# Patient Record
Sex: Male | Born: 1975
Health system: Southern US, Community
[De-identification: ages and names within clinical notes are randomized; demographics above are authoritative.]

## PROBLEM LIST (undated history)

## (undated) DIAGNOSIS — Z800 Family history of malignant neoplasm of digestive organs: Secondary | ICD-10-CM

## (undated) DIAGNOSIS — K589 Irritable bowel syndrome without diarrhea: Secondary | ICD-10-CM

## (undated) HISTORY — PX: WISDOM TOOTH EXTRACTION: SHX21

## (undated) HISTORY — DX: Family history of malignant neoplasm of digestive organs: Z80.0

## (undated) HISTORY — PX: TONSILLECTOMY: SUR1361

## (undated) HISTORY — DX: Irritable bowel syndrome, unspecified: K58.9

---

## 2006-04-21 ENCOUNTER — Encounter: Admission: RE | Admit: 2006-04-21 | Discharge: 2006-04-21 | Payer: Self-pay | Admitting: Gastroenterology

## 2019-05-24 ENCOUNTER — Telehealth: Payer: Self-pay

## 2019-05-24 NOTE — Telephone Encounter (Signed)

## 2019-05-25 ENCOUNTER — Ambulatory Visit: Payer: BC Managed Care – PPO | Admitting: Family Medicine

## 2019-05-25 ENCOUNTER — Encounter: Payer: Self-pay | Admitting: Family Medicine

## 2019-05-25 VITALS — BP 118/70 | HR 60 | Ht 70.0 in | Wt 176.5 lb

## 2019-05-25 DIAGNOSIS — Z800 Family history of malignant neoplasm of digestive organs: Secondary | ICD-10-CM

## 2019-05-25 DIAGNOSIS — D229 Melanocytic nevi, unspecified: Secondary | ICD-10-CM | POA: Diagnosis not present

## 2019-05-25 DIAGNOSIS — Z0000 Encounter for general adult medical examination without abnormal findings: Secondary | ICD-10-CM | POA: Diagnosis not present

## 2019-05-25 NOTE — Patient Instructions (Signed)

## 2019-05-25 NOTE — Progress Notes (Signed)
Established Patient Office Visit  Subjective:  Patient ID: William Rasmussen, male    DOB: 08/26/1976  Age: 43 y.o. MRN: [id number]  CC:  Chief Complaint  Patient presents with  . Establish Care  . Annual Exam    HPI William Rasmussen presents for establishment of care and a complete physical.  He is nonfasting today.  In general enjoys good health as far as he knows.  He lives with his wife and 15-monthold daughter.  They are scheduled to have a son at the end of August for LTC S.  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  He does not smoke or use illicit drugs.  He drinks beers on occasion.  Father died at age 2866secondary to chronic lymphocytic leukemia.  Mom is 720and was recently diagnosed with colon cancer she has a current colostomy.  Patient does have a history of irritable bowel syndrome.  It has not bothered him in some time now.  History reviewed. No pertinent past medical history.  History reviewed. No pertinent surgical history.  History reviewed. No pertinent family history.  Social History   Socioeconomic History  . Marital status: Married    Spouse name: Not on file  . Number of children: Not on file  . Years of education: Not on file  . Highest education level: Not on file  Occupational History  . Not on file  Social Needs  . Financial resource strain: Not on file  . Food insecurity    Worry: Not on file    Inability: Not on file  . Transportation needs    Medical: Not on file    Non-medical: Not on file  Tobacco Use  . Smoking status: Never Smoker  . Smokeless tobacco: Never Used  Substance and Sexual Activity  . Alcohol use: Yes    Comment: occassionally has a couple of beers.  . Drug use: Not Currently  . Sexual activity: Not on file  Lifestyle  . Physical activity    Days per week: Not on file    Minutes per session: Not on file  . Stress: Not on file  Relationships  . Social  [place] phone: Not on file    Gets together: Not on file    Attends religious service: Not on file    Active member of club or organization: Not on file    Attends meetings of clubs or organizations: Not on file    Relationship status: Not on file  . Intimate partner violence    Fear of current or ex partner: Not on file    Emotionally abused: Not on file    Physically abused: Not on file    Forced sexual activity: Not on file  Other Topics Concern  . Not on file  Social History Narrative  . Not on file    No outpatient medications prior to visit.   No facility-administered medications prior to visit.     No Known Allergies  ROS Review of Systems  Constitutional: Negative for diaphoresis, fatigue, fever and unexpected weight change.  HENT: Negative.   Eyes: Negative for photophobia and visual disturbance.  Respiratory: Negative.   Cardiovascular: Negative.   Gastrointestinal: Negative for abdominal pain, anal bleeding, blood in stool, constipation, diarrhea, nausea and vomiting.  Endocrine: Negative for polyphagia and polyuria.  Genitourinary: Negative.   Musculoskeletal: Negative for joint swelling and myalgias.  Skin:  Negative for pallor and rash.  Allergic/Immunologic: Negative for immunocompromised state.  Neurological: Negative for light-headedness and headaches.  Hematological: Does not bruise/bleed easily.  Psychiatric/Behavioral: Negative.       Objective:    Physical Exam  Constitutional: He is oriented to person, place, and time. He appears well-developed and well-nourished. No distress.  HENT:  Head: Normocephalic and atraumatic.  Right Ear: External ear normal.  Left Ear: External ear normal.  Mouth/Throat: Oropharynx is clear and moist. No oropharyngeal exudate.  Eyes: Pupils are equal, round, and reactive to light. Conjunctivae are normal. Right eye exhibits no discharge. Left eye exhibits no discharge. No scleral icterus.  Neck:  Neck supple. No tracheal deviation present. No thyromegaly present.  Cardiovascular: Normal rate, regular rhythm and normal heart sounds.  Pulmonary/Chest: Effort normal and breath sounds normal. No stridor.  Abdominal: Soft. Bowel sounds are normal. He exhibits no distension and no mass. There is no abdominal tenderness. There is no rebound and no guarding. Hernia confirmed negative in the right inguinal area and confirmed negative in the left inguinal area.  Genitourinary: Right testis shows no mass, no swelling and no tenderness. Right testis is descended. Left testis shows no mass, no swelling and no tenderness. Left testis is descended. Circumcised. No hypospadias, penile erythema or penile tenderness. No discharge found.  Musculoskeletal:        General: No edema.     Lumbar back: He exhibits normal range of motion, no tenderness, no bony tenderness and no swelling.  Lymphadenopathy:    He has no cervical adenopathy.       Right: No inguinal adenopathy present.       Left: No inguinal adenopathy present.  Neurological: He is alert and oriented to person, place, and time.  Skin: Skin is warm and dry. He is not diaphoretic.     Psychiatric: He has a normal mood and affect. His behavior is normal.    BP 118/70   Pulse 60   Ht '5\' 10"'  (1.778 m)   Wt 176 lb 8 oz (80.1 kg)   SpO2 97%   BMI 25.33 kg/m  Wt Readings from Last 3 Encounters:  05/25/19 176 lb 8 oz (80.1 kg)   BP Readings from Last 3 Encounters:  05/25/19 118/70   Guideline developer:  UpToDate (see UpToDate for funding source) Date Released: June 2014  Health Maintenance Due  Topic Date Due  . HIV Screening  06/30/1993  . TETANUS/TDAP  06/30/1997    There are no preventive care reminders to display for this patient.  No results found for: TSH No results found for: WBC, HGB, HCT, MCV, PLT No results found for: NA, K, CHLORIDE, CO2, GLUCOSE, BUN, CREATININE, BILITOT, ALKPHOS, AST, ALT, PROT, ALBUMIN, CALCIUM,  ANIONGAP, EGFR, GFR No results found for: CHOL No results found for: HDL No results found for: LDLCALC No results found for: TRIG No results found for: CHOLHDL No results found for: HGBA1C    Assessment & Plan:   Problem List Items Addressed This Visit      Musculoskeletal and Integument   Multiple atypical nevi   Relevant Orders   Ambulatory referral to Dermatology     Other   Healthcare maintenance - Primary   Relevant Orders   CBC   Comprehensive metabolic panel   Lipid panel   Urinalysis, Routine w reflex microscopic   HIV Antibody (routine testing w rflx)   Family history of colon cancer in mother   Relevant Orders   Ambulatory referral  to Gastroenterology      No orders of the defined types were placed in this encounter.   Follow-up: Return in about 6 months (around 11/24/2019), or if symptoms worsen or fail to improve.

## 2019-05-29 ENCOUNTER — Telehealth (INDEPENDENT_AMBULATORY_CARE_PROVIDER_SITE_OTHER): Payer: BC Managed Care – PPO | Admitting: Gastroenterology

## 2019-05-29 ENCOUNTER — Other Ambulatory Visit: Payer: Self-pay

## 2019-05-29 ENCOUNTER — Encounter: Payer: Self-pay | Admitting: Gastroenterology

## 2019-05-29 VITALS — Ht 70.0 in | Wt 178.0 lb

## 2019-05-29 DIAGNOSIS — Z800 Family history of malignant neoplasm of digestive organs: Secondary | ICD-10-CM | POA: Diagnosis not present

## 2019-05-29 MED ORDER — CLENPIQ 10-3.5-12 MG-GM -GM/160ML PO SOLN: 1.0000 | ORAL | 0 refills | Status: DC

## 2019-05-29 NOTE — Progress Notes (Signed)
              Chief Complaint: FHx of CRC  Referring Provider:     Libby Maw, MD   HPI:    Due to current restrictions/limitations of in-office visits due to the COVID-19 pandemic, this scheduled clinical appointment was converted to a telehealth virtual consultation using Doximity.  -Time: 25 minutes -The patient did consent to this virtual visit and is aware of possible charges through their insurance for this visit.  -Names of all parties present: William Rasmussen (patient), Gerrit Heck, DO, [facility] (physician) -Patient location: Home -Physician location: Office  William Rasmussen is a 43 y.o. male referred to the Gastroenterology Clinic for evaluation of early CRC screening due to family history of CRC.  Family history notable for father died at age 65 2/2 CLL, and mother diagnosed with CRC at age 80.   He reports a history of IBS-Mixed type, which is well controlled with dietary modifications.  Has not had symptoms in a long time.  Patient otherwise denies nausea, vomiting, diarrhea, constipation, hematochezia, melena, night sweats, fever, chills, weight loss, early satiety, dysphagia, odynophagia.  CBC, CMP ordered; not yet done- scheduled to complete week.   Past medical history, past surgical history, social history, family history, medications, and allergies reviewed in the chart and with patient.    Past Medical History:  Diagnosis Date  . IBS (irritable bowel syndrome)      History reviewed. No pertinent surgical history. Family History  Problem Relation Age of Onset  . Colon cancer Mother 78   Social History   Tobacco Use  . Smoking status: Never Smoker  . Smokeless tobacco: Never Used  Substance Use Topics  . Alcohol use: Yes    Comment: occassionally has a couple of beers.  . Drug use: Not Currently   No current outpatient medications on file.   No current facility-administered medications for this visit.    No Known Allergies    Review of Systems: All systems reviewed and negative except where noted in HPI.     Physical Exam:    Complete physical exam not completed due to the nature of this telehealth communication.   Gen: Awake, alert, and oriented, and well communicative. HEENT: EOMI, non-icteric sclera, NCAT, MMM Neck: Normal movement of head and neck Pulm: No labored breathing, speaking in full sentences without conversational dyspnea Derm: No apparent lesions or bruising in visible field MS: Moves all visible extremities without noticeable abnormality Psych: Pleasant, cooperative, normal speech, thought processing seemingly intact   ASSESSMENT AND PLAN;   1) Family history of colon cancer: Mother diagnosed with CRC at age 81.  Per current societal guidelines, start colon cancer screening at age 19, with ongoing intervals pending colonoscopy findings.  -Schedule for colonoscopy  The indications, risks, and benefits of colonoscopy were explained to the patient in detail. Risks include but are not limited to bleeding, perforation, adverse reaction to medications, and cardiopulmonary compromise. Sequelae include but are not limited to the possibility of surgery, hospitalization, and mortality. The patient verbalized understanding and wished to proceed. All questions answered, referred to the scheduler and bowel prep ordered. Further recommendations pending results of the exam.    Lavena Bullion, DO, FACG  05/29/2019, 3:45 PM   Ethelene Hal Mortimer Fries,*

## 2019-05-29 NOTE — Patient Instructions (Signed)
If you are age 41 or older, your body mass index should be between 23-30. Your Body mass index is 25.54 kg/m. If this is out of the aforementioned range listed, please consider follow up with your Primary Care Provider.  If you are age 77 or younger, your body mass index should be between 19-25. Your Body mass index is 25.54 kg/m. If this is out of the aformentioned range listed, please consider follow up with your Primary Care Provider.   To help prevent the possible spread of infection to our patients, communities, and staff; we will be implementing the following measures:  As of now we are not allowing any visitors/family members to accompany you to any upcoming appointments with [facility]. If you have any concerns about this please contact our office to discuss prior to the appointment.   You have been scheduled for a colonoscopy. Please follow written instructions given to you at your visit today.  Please pick up your prep supplies at the pharmacy within the next 1-3 days. If you use inhalers (even only as needed), please bring them with you on the day of your procedure. Your physician has requested that you go to www.startemmi.com and enter the access code given to you at your visit today. This web site gives a general overview about your procedure. However, you should still follow specific instructions given to you by our office regarding your preparation for the procedure.  We have sent the following medications to your pharmacy for you to pick up at your convenience: Clenpiq  It was a pleasure to see you today!  Vito Cirigliano, D.O.

## 2019-06-01 ENCOUNTER — Other Ambulatory Visit (INDEPENDENT_AMBULATORY_CARE_PROVIDER_SITE_OTHER): Payer: BC Managed Care – PPO

## 2019-06-01 ENCOUNTER — Other Ambulatory Visit: Payer: Self-pay

## 2019-06-01 DIAGNOSIS — Z0000 Encounter for general adult medical examination without abnormal findings: Secondary | ICD-10-CM

## 2019-06-01 LAB — CBC
HCT: 44.1 % (ref 39.0–52.0)
Hemoglobin: 14.7 g/dL (ref 13.0–17.0)
MCHC: 33.4 g/dL (ref 30.0–36.0)
MCV: 101 fl — ABNORMAL HIGH (ref 78.0–100.0)
Platelets: 232 10*3/uL (ref 150.0–400.0)
RBC: 4.36 Mil/uL (ref 4.22–5.81)
RDW: 13.4 % (ref 11.5–15.5)
WBC: 5.1 10*3/uL (ref 4.0–10.5)

## 2019-06-01 LAB — LIPID PANEL
Cholesterol: 162 mg/dL (ref 0–200)
HDL: 39.3 mg/dL (ref >39.00)
LDL Cholesterol: 99 mg/dL (ref 0–99)
NonHDL: 122.82
Total CHOL/HDL Ratio: 4
Triglycerides: 119 mg/dL (ref 0.0–149.0)
VLDL: 23.8 mg/dL (ref 0.0–40.0)

## 2019-06-01 LAB — COMPREHENSIVE METABOLIC PANEL
ALT: 19 U/L (ref 0–53)
AST: 18 U/L (ref 0–37)
Albumin: 4.3 g/dL (ref 3.5–5.2)
Alkaline Phosphatase: 61 U/L (ref 39–117)
BUN: 12 mg/dL (ref 6–23)
CO2: 30 mEq/L (ref 19–32)
Calcium: 9.1 mg/dL (ref 8.4–10.5)
Chloride: 105 mEq/L (ref 96–112)
Creatinine, Ser: 0.89 mg/dL (ref 0.40–1.50)
GFR: 94.24 mL/min (ref >60.00)
Glucose, Bld: 90 mg/dL (ref 70–99)
Potassium: 4.2 mEq/L (ref 3.5–5.1)
Sodium: 142 mEq/L (ref 135–145)
Total Bilirubin: 1.3 mg/dL — ABNORMAL HIGH (ref 0.2–1.2)
Total Protein: 6.6 g/dL (ref 6.0–8.3)

## 2019-06-01 LAB — URINALYSIS, ROUTINE W REFLEX MICROSCOPIC
Bilirubin Urine: NEGATIVE
Hgb urine dipstick: NEGATIVE
Ketones, ur: NEGATIVE
Leukocytes,Ua: NEGATIVE
Nitrite: NEGATIVE
Specific Gravity, Urine: 1.02 (ref 1.000–1.030)
Total Protein, Urine: NEGATIVE
Urine Glucose: NEGATIVE
Urobilinogen, UA: 0.2 (ref 0.0–1.0)
pH: 8 (ref 5.0–8.0)

## 2019-06-02 LAB — HIV ANTIBODY (ROUTINE TESTING W REFLEX): HIV 1&2 Ab, 4th Generation: NONREACTIVE

## 2019-11-05 ENCOUNTER — Other Ambulatory Visit: Payer: Self-pay

## 2019-11-05 DIAGNOSIS — Z20822 Contact with and (suspected) exposure to covid-19: Secondary | ICD-10-CM

## 2019-11-06 LAB — NOVEL CORONAVIRUS, NAA: SARS-CoV-2, NAA: NOT DETECTED

## 2019-11-09 ENCOUNTER — Encounter: Payer: Self-pay | Admitting: Family Medicine

## 2019-11-09 DIAGNOSIS — Z3009 Encounter for other general counseling and advice on contraception: Secondary | ICD-10-CM

## 2019-11-12 NOTE — Telephone Encounter (Signed)
Left message for patient to call back to the office;  

## 2019-11-13 NOTE — Telephone Encounter (Signed)
Left message for patient to call back to the office;  

## 2019-11-14 NOTE — Telephone Encounter (Signed)
Called and spoke with patient-patient has been scheduled for his pre visit on 11/20/2019 at 8:30 am; patient has been scheduled for his COVID screening on 11/28/2019 at 8:10 am; patient has also been scheduled for his colonoscopy at [facility] on 12/04/2019 at 3:00 pm;   Patient advised to call back to the office at [phone] should questions/concerns arise;  Patient verbalized understanding of information/instructions;

## 2019-11-14 NOTE — Telephone Encounter (Signed)
Patient returned call to the office-message left for RN;  RN attempted to reach patient-left message for patient to call back to the office;

## 2019-11-20 ENCOUNTER — Encounter: Payer: Self-pay | Admitting: Gastroenterology

## 2019-11-20 ENCOUNTER — Ambulatory Visit (AMBULATORY_SURGERY_CENTER): Payer: Commercial Managed Care - PPO | Admitting: *Deleted

## 2019-11-20 ENCOUNTER — Other Ambulatory Visit: Payer: Self-pay

## 2019-11-20 VITALS — Temp 96.9°F | Ht 70.0 in | Wt 180.0 lb

## 2019-11-20 DIAGNOSIS — Z1159 Encounter for screening for other viral diseases: Secondary | ICD-10-CM

## 2019-11-20 DIAGNOSIS — Z800 Family history of malignant neoplasm of digestive organs: Secondary | ICD-10-CM

## 2019-11-20 MED ORDER — CLENPIQ 10-3.5-12 MG-GM -GM/160ML PO SOLN: 1.0000 | ORAL | 2 refills | Status: DC

## 2019-11-20 NOTE — Progress Notes (Signed)
No egg or soy allergy known to patient  No issues with past sedation with any surgeries  or procedures, no intubation problems  No diet pills per patient No home 02 use per patient  No blood thinners per patient  Pt denies issues with constipation  No A fib or A flutter  EMMI video sent to pt's e mail  Clenpiq coupon to pt today   Due to the COVID-19 pandemic we are asking patients to follow these guidelines. Please only bring one care partner. Please be aware that your care partner may wait in the car in the parking lot or if they feel like they will be too hot to wait in the car, they may wait in the lobby on the 4th floor. All care partners are required to wear a mask the entire time (we do not have any that we can provide them), they need to practice social distancing, and we will do a Covid check for all patient's and care partners when you arrive. Also we will check their temperature and your temperature. If the care partner waits in their car they need to stay in the parking lot the entire time and we will call them on their cell phone when the patient is ready for discharge so they can bring the car to the front of the building. Also all patient's will need to wear a mask into building.

## 2019-11-28 ENCOUNTER — Ambulatory Visit (INDEPENDENT_AMBULATORY_CARE_PROVIDER_SITE_OTHER): Payer: Commercial Managed Care - PPO

## 2019-11-28 ENCOUNTER — Other Ambulatory Visit: Payer: Self-pay | Admitting: Gastroenterology

## 2019-11-28 DIAGNOSIS — Z1159 Encounter for screening for other viral diseases: Secondary | ICD-10-CM

## 2019-11-29 LAB — SARS CORONAVIRUS 2 (TAT 6-24 HRS): SARS Coronavirus 2: NEGATIVE

## 2019-12-04 ENCOUNTER — Ambulatory Visit (AMBULATORY_SURGERY_CENTER): Payer: Commercial Managed Care - PPO | Admitting: Gastroenterology

## 2019-12-04 ENCOUNTER — Other Ambulatory Visit: Payer: Self-pay

## 2019-12-04 ENCOUNTER — Encounter: Payer: Self-pay | Admitting: Gastroenterology

## 2019-12-04 VITALS — BP 115/69 | HR 56 | Temp 98.5°F | Resp 19 | Ht 70.0 in | Wt 180.0 lb

## 2019-12-04 DIAGNOSIS — D125 Benign neoplasm of sigmoid colon: Secondary | ICD-10-CM

## 2019-12-04 DIAGNOSIS — K640 First degree hemorrhoids: Secondary | ICD-10-CM

## 2019-12-04 DIAGNOSIS — Z800 Family history of malignant neoplasm of digestive organs: Secondary | ICD-10-CM | POA: Diagnosis present

## 2019-12-04 DIAGNOSIS — Z1211 Encounter for screening for malignant neoplasm of colon: Secondary | ICD-10-CM

## 2019-12-04 MED ORDER — SODIUM CHLORIDE 0.9 % IV SOLN: 500.0000 mL | Freq: Once | INTRAVENOUS | Status: DC

## 2019-12-04 NOTE — Progress Notes (Signed)
Called to room to assist during endoscopic procedure.  Patient ID and intended procedure confirmed with present staff. Received instructions for my participation in the procedure from the performing physician.  

## 2019-12-04 NOTE — Progress Notes (Signed)
PT taken to PACU. Monitors in place. VSS. Report given to RN. 

## 2019-12-04 NOTE — Progress Notes (Signed)
Temp by JB, Vitals by CW   Pt's states no medical or surgical changes since previsit or office visit.  

## 2019-12-04 NOTE — Op Note (Signed)
[facility] Patient Name: William Rasmussen Procedure Date: 12/04/2019 2:24 PM MRN: [id number] Endoscopist: Gerrit Heck , MD Age: 43 Referring MD:  Date of Birth: [birth year] Gender: Male Account #: [id number] Procedure:                Colonoscopy Indications:              Screening in patient at increased risk: Colorectal                            cancer in mother 5 or older, This is the patient's                            first colonoscopy                           43 yo male presents for initial, increased risk CRC                            screening. Mother with CRC >age 56. Medicines:                Monitored Anesthesia Care Procedure:                Pre-Anesthesia Assessment:                           - Prior to the procedure, a History and Physical                            was performed, and patient medications and                            allergies were reviewed. The patient's tolerance of                            previous anesthesia was also reviewed. The risks                            and benefits of the procedure and the sedation                            options and risks were discussed with the patient.                            All questions were answered, and informed consent                            was obtained. Prior Anticoagulants: The patient has                            taken no previous anticoagulant or antiplatelet                            agents. ASA Grade Assessment: II - A patient with  mild systemic disease. After reviewing the risks                            and benefits, the patient was deemed in                            satisfactory condition to undergo the procedure.                           After obtaining informed consent, the colonoscope                            was passed under direct vision. Throughout the                            procedure, the patient's blood pressure, pulse, and                           oxygen saturations were monitored continuously. The                            Colonoscope was introduced through the anus and                            advanced to the the terminal ileum. The colonoscopy                            was performed without difficulty. The patient                            tolerated the procedure well. The quality of the                            bowel preparation was excellent. The terminal                            ileum, ileocecal valve, appendiceal orifice, and                            rectum were photographed. Scope In: 2:32:41 PM Scope Out: 2:50:31 PM Scope Withdrawal Time: 0 hours 14 minutes 49 seconds  Total Procedure Duration: 0 hours 17 minutes 50 seconds  Findings:                 The perianal and digital rectal examinations were                            normal.                           A 4 mm polyp was found in the sigmoid colon. The                            polyp was sessile. The polyp was removed with a  cold snare. Resection and retrieval were complete.                            Estimated blood loss was minimal.                           Non-bleeding internal hemorrhoids were found during                            retroflexion. The hemorrhoids were small.                           The exam was otherwise normal throughout the                            remainder of the colon.                           Retroflexion in the right colon was performed.                           The terminal ileum appeared normal. Complications:            No immediate complications. Estimated Blood Loss:     Estimated blood loss was minimal. Impression:               - One 4 mm polyp in the sigmoid colon, removed with                            a cold snare. Resected and retrieved.                           - Non-bleeding internal hemorrhoids.                           - The examined portion of the ileum  was normal. Recommendation:           - Patient has a contact number available for                            emergencies. The signs and symptoms of potential                            delayed complications were discussed with the                            patient. Return to normal activities tomorrow.                            Written discharge instructions were provided to the                            patient.                           - Resume previous diet.                           -  Continue present medications.                           - Await pathology results.                           - Repeat colonoscopy in 5 years for surveillance.                           - Return to GI office PRN.                           - Internal hemorrhoids were noted on this study and                            may be amenable to hemorrhoid band ligation. If you                            are interested in further treatment of these                            hemorrhoids with band ligation, please contact my                            clinic to set up an appointment for evaluation and                            treatment. Gerrit Heck, MD 12/04/2019 2:56:20 PM

## 2019-12-04 NOTE — Patient Instructions (Signed)
Information on polyps and hemorrhoids given to you today.  Await pathology results.  Repeat colonoscopy in 5 years.  YOU HAD AN ENDOSCOPIC PROCEDURE TODAY AT [facility]:   Refer to the procedure report that was given to you for any specific questions about what was found during the examination.  If the procedure report does not answer your questions, please call your gastroenterologist to clarify.  If you requested that your care partner not be given the details of your procedure findings, then the procedure report has been included in a sealed envelope for you to review at your convenience later.  YOU SHOULD EXPECT: Some feelings of bloating in the abdomen. Passage of more gas than usual.  Walking can help get rid of the air that was put into your GI tract during the procedure and reduce the bloating. If you had a lower endoscopy (such as a colonoscopy or flexible sigmoidoscopy) you may notice spotting of blood in your stool or on the toilet paper. If you underwent a bowel prep for your procedure, you may not have a normal bowel movement for a few days.  Please Note:  You might notice some irritation and congestion in your nose or some drainage.  This is from the oxygen used during your procedure.  There is no need for concern and it should clear up in a day or so.  SYMPTOMS TO REPORT IMMEDIATELY:   Following lower endoscopy (colonoscopy or flexible sigmoidoscopy):  Excessive amounts of blood in the stool  Significant tenderness or worsening of abdominal pains  Swelling of the abdomen that is new, acute  Fever of 100F or higher    For urgent or emergent issues, a gastroenterologist can be reached at any hour by calling [phone].   DIET:  We do recommend a small meal at first, but then you may proceed to your regular diet.  Drink plenty of fluids but you should avoid alcoholic beverages for 24 hours.  ACTIVITY:  You should plan to take it easy for the rest of  today and you should NOT DRIVE or use heavy machinery until tomorrow (because of the sedation medicines used during the test).    FOLLOW UP: Our staff will call the number listed on your records 48-72 hours following your procedure to check on you and address any questions or concerns that you may have regarding the information given to you following your procedure. If we do not reach you, we will leave a message.  We will attempt to reach you two times.  During this call, we will ask if you have developed any symptoms of COVID 19. If you develop any symptoms (ie: fever, flu-like symptoms, shortness of breath, cough etc.) before then, please call [phone].  If you test positive for Covid 19 in the 2 weeks post procedure, please call and report this information to [place].    If any biopsies were taken you will be contacted by phone or by letter within the next 1-3 weeks.  Please call us at [phone] if you have not heard about the biopsies in 3 weeks.    SIGNATURES/CONFIDENTIALITY: You and/or your care partner have signed paperwork which will be entered into your electronic medical record.  These signatures attest to the fact that that the information above on your After Visit Summary has been reviewed and is understood.  Full responsibility of the confidentiality of this discharge information lies with you and/or your care-partner.

## 2019-12-06 ENCOUNTER — Telehealth: Payer: Self-pay

## 2019-12-06 NOTE — Telephone Encounter (Signed)
  Follow up Call-  Call back number 12/04/2019  Post procedure Call Back phone  # [phone]  Permission to leave phone message Yes  Some recent data might be hidden     Left message

## 2019-12-10 ENCOUNTER — Telehealth: Payer: Self-pay | Admitting: *Deleted

## 2019-12-10 ENCOUNTER — Encounter: Payer: Self-pay | Admitting: Gastroenterology

## 2019-12-12 NOTE — Telephone Encounter (Signed)
error 

## 2020-04-12 ENCOUNTER — Encounter: Payer: Self-pay | Admitting: Family Medicine

## 2020-04-21 ENCOUNTER — Other Ambulatory Visit: Payer: Self-pay

## 2020-04-22 ENCOUNTER — Encounter: Payer: Self-pay | Admitting: Family Medicine

## 2020-04-22 ENCOUNTER — Ambulatory Visit (INDEPENDENT_AMBULATORY_CARE_PROVIDER_SITE_OTHER): Payer: Commercial Managed Care - PPO

## 2020-04-22 ENCOUNTER — Telehealth: Payer: Self-pay | Admitting: Family Medicine

## 2020-04-22 ENCOUNTER — Ambulatory Visit: Payer: Commercial Managed Care - PPO | Admitting: Family Medicine

## 2020-04-22 VITALS — BP 122/70 | HR 77 | Temp 97.9°F | Ht 70.0 in | Wt 178.0 lb

## 2020-04-22 DIAGNOSIS — S92425A Nondisplaced fracture of distal phalanx of left great toe, initial encounter for closed fracture: Secondary | ICD-10-CM | POA: Diagnosis not present

## 2020-04-22 DIAGNOSIS — Z3009 Encounter for other general counseling and advice on contraception: Secondary | ICD-10-CM

## 2020-04-22 DIAGNOSIS — S96912A Strain of unspecified muscle and tendon at ankle and foot level, left foot, initial encounter: Secondary | ICD-10-CM

## 2020-04-22 NOTE — Progress Notes (Signed)
Established Patient Office Visit  Subjective:  Patient ID: William Rasmussen, male    DOB: 1976/10/04  Age: 44 y.o. MRN: [id number]  CC:  Chief Complaint  Patient presents with  . Pain    left great toe injury 10 days ago still very painful.     HPI William Rasmussen presents for evaluation and treatment of a left great toe injury 9 days ago.  Toe remains painful with ambulation and rest.  He is treated it with rest and cold packs over-the-counter anti-inflammatories.  Past Medical History:  Diagnosis Date  . Family history of colon cancer    mother age 85   . IBS (irritable bowel syndrome)     Past Surgical History:  Procedure Laterality Date  . TONSILLECTOMY    . WISDOM TOOTH EXTRACTION      Family History  Problem Relation Age of Onset  . Colon cancer Mother 65  . Colon polyps Mother   . Esophageal cancer Neg Hx   . Rectal cancer Neg Hx   . Stomach cancer Neg Hx     Social History   Socioeconomic History  . Marital status: Married    Spouse name: Not on file  . Number of children: Not on file  . Years of education: Not on file  . Highest education level: Not on file  Occupational History  . Not on file  Tobacco Use  . Smoking status: Never Smoker  . Smokeless tobacco: Never Used  Substance and Sexual Activity  . Alcohol use: Yes    Comment: occassionally has a couple of beers.  . Drug use: Not Currently  . Sexual activity: Not on file  Other Topics Concern  . Not on file  Social History Narrative  . Not on file   Social Determinants of Health   Financial Resource Strain:   . Difficulty of Paying Living Expenses:   Food Insecurity:   . Worried About Charity fundraiser in the Last Year:   . Arboriculturist in the Last Year:   Transportation Needs:   . Film/video editor (Medical):   Marland Kitchen Lack of Transportation (Non-Medical):   Physical Activity:   . Days of Exercise per Week:   . Minutes of Exercise per Session:   Stress:   . Feeling of  Stress :   Social Connections:   . Frequency of Communication with Friends and Family:   . Frequency of Social Gatherings with Friends and Family:   . Attends Religious Services:   . Active Member of Clubs or Organizations:   . Attends Archivist Meetings:   Marland Kitchen Marital Status:   Intimate Partner Violence:   . Fear of Current or Ex-Partner:   . Emotionally Abused:   Marland Kitchen Physically Abused:   . Sexually Abused:     No outpatient medications prior to visit.   No facility-administered medications prior to visit.    No Known Allergies  ROS Review of Systems  Constitutional: Negative.   Respiratory: Negative.   Cardiovascular: Negative.   Gastrointestinal: Negative.   Musculoskeletal: Positive for arthralgias and gait problem.  Psychiatric/Behavioral: Negative.       Objective:    Physical Exam  Constitutional: He is oriented to person, place, and time. He appears well-developed and well-nourished. No distress.  HENT:  Head: Normocephalic and atraumatic.  Right Ear: External ear normal.  Left Ear: External ear normal.  Eyes: Conjunctivae are normal. Right eye exhibits no discharge. Left eye exhibits  no discharge. No scleral icterus.  Neck: No JVD present. No tracheal deviation present.  Pulmonary/Chest: Effort normal. No stridor.  Musculoskeletal:       Feet:  Neurological: He is alert and oriented to person, place, and time.  Skin: He is not diaphoretic.  Psychiatric: He has a normal mood and affect. His behavior is normal.    BP 122/70   Pulse 77   Temp 97.9 F (36.6 C) (Tympanic)   Ht 5\' 10"  (1.778 m)   Wt 178 lb (80.7 kg)   SpO2 98%   BMI 25.54 kg/m  Wt Readings from Last 3 Encounters:  04/22/20 178 lb (80.7 kg)  12/04/19 180 lb (81.6 kg)  11/20/19 180 lb (81.6 kg)     Health Maintenance Due  Topic Date Due  . TETANUS/TDAP  Never done    There are no preventive care reminders to display for this patient.  No results found for: TSH Lab  Results  Component Value Date   WBC 5.1 06/01/2019   HGB 14.7 06/01/2019   HCT 44.1 06/01/2019   MCV 101.0 (H) 06/01/2019   PLT 232.0 06/01/2019   Lab Results  Component Value Date   NA 142 06/01/2019   K 4.2 06/01/2019   CO2 30 06/01/2019   GLUCOSE 90 06/01/2019   BUN 12 06/01/2019   CREATININE 0.89 06/01/2019   BILITOT 1.3 (H) 06/01/2019   ALKPHOS 61 06/01/2019   AST 18 06/01/2019   ALT 19 06/01/2019   PROT 6.6 06/01/2019   ALBUMIN 4.3 06/01/2019   CALCIUM 9.1 06/01/2019   GFR 94.24 06/01/2019   Lab Results  Component Value Date   CHOL 162 06/01/2019   Lab Results  Component Value Date   HDL 39.30 06/01/2019   Lab Results  Component Value Date   LDLCALC 99 06/01/2019   Lab Results  Component Value Date   TRIG 119.0 06/01/2019   Lab Results  Component Value Date   CHOLHDL 4 06/01/2019   No results found for: HGBA1C    Assessment & Plan:   Problem List Items Addressed This Visit      Musculoskeletal and Integument   Strain of great toe, left, initial encounter - Primary   Relevant Orders   DG Foot Complete Left (Completed)   Shoe   Closed nondisplaced fracture of distal phalanx of left great toe   Relevant Orders   Ambulatory referral to Sports Medicine      No orders of the defined types were placed in this encounter.   Follow-up: No follow-ups on file.    Libby Maw, MD

## 2020-04-22 NOTE — Telephone Encounter (Signed)
Spoke with patient who verbally understood where he could purchase a hard shoe.

## 2020-04-22 NOTE — Telephone Encounter (Signed)
Patient called back and sated that he was just seen and was supposed to have a boot called into the pharmacy. Pls advise. CB is [phone]

## 2020-04-23 ENCOUNTER — Other Ambulatory Visit: Payer: Self-pay

## 2020-04-23 ENCOUNTER — Ambulatory Visit (INDEPENDENT_AMBULATORY_CARE_PROVIDER_SITE_OTHER): Payer: Commercial Managed Care - PPO | Admitting: Family Medicine

## 2020-04-23 ENCOUNTER — Encounter: Payer: Self-pay | Admitting: Family Medicine

## 2020-04-23 DIAGNOSIS — S92425D Nondisplaced fracture of distal phalanx of left great toe, subsequent encounter for fracture with routine healing: Secondary | ICD-10-CM | POA: Diagnosis not present

## 2020-04-23 NOTE — Patient Instructions (Signed)
Nice to meet you Please continue the hard soled shoe  You can transition to buddy taping when the pain has improved   Please send me a message in MyChart with any questions or updates.  Please see me back in 3 weeks.   --Dr. Raeford Razor

## 2020-04-23 NOTE — Assessment & Plan Note (Signed)
Injury occurred on 5/8.  Ecchymosis has improved.  Pain is still ongoing. -Counseled on supportive care. -Can continue the postop shoe. -Once pain is improved he can try to wean out of the postop shoe and start buddy tape. - reimage follow-up.

## 2020-04-23 NOTE — Progress Notes (Signed)
William Rasmussen - 44 y.o. male MRN [id number]  Date of birth: 1976-02-04  SUBJECTIVE:  Including CC & ROS.  Chief Complaint  Patient presents with  . Toe Injury    left Great Toe    William Rasmussen is a 44 y.o. male that is presenting with left great toe pain.  He had an injury that occurred on 5/3.  He had a traumatic injury to his toe.  He has been walking on it since that time.  He has had swelling and pain over the distal aspect of the toe..  Independent review of the left foot x-ray from 5/18 shows a nondisplaced fracture of the distal phalanx of the left great toe.   Review of Systems See HPI   HISTORY: Past Medical, Surgical, Social, and Family History Reviewed & Updated per EMR.   Pertinent Historical Findings include:  Past Medical History:  Diagnosis Date  . Family history of colon cancer    mother age 43   . IBS (irritable bowel syndrome)     Past Surgical History:  Procedure Laterality Date  . TONSILLECTOMY    . WISDOM TOOTH EXTRACTION      Family History  Problem Relation Age of Onset  . Colon cancer Mother 59  . Colon polyps Mother   . Esophageal cancer Neg Hx   . Rectal cancer Neg Hx   . Stomach cancer Neg Hx     Social History   Socioeconomic History  . Marital status: Married    Spouse name: Not on file  . Number of children: Not on file  . Years of education: Not on file  . Highest education level: Not on file  Occupational History  . Not on file  Tobacco Use  . Smoking status: Never Smoker  . Smokeless tobacco: Never Used  Substance and Sexual Activity  . Alcohol use: Yes    Comment: occassionally has a couple of beers.  . Drug use: Not Currently  . Sexual activity: Not on file  Other Topics Concern  . Not on file  Social History Narrative  . Not on file   Social Determinants of Health   Financial Resource Strain:   . Difficulty of Paying Living Expenses:   Food Insecurity:   . Worried About Charity fundraiser in the Last  Year:   . Arboriculturist in the Last Year:   Transportation Needs:   . Film/video editor (Medical):   Marland Kitchen Lack of Transportation (Non-Medical):   Physical Activity:   . Days of Exercise per Week:   . Minutes of Exercise per Session:   Stress:   . Feeling of Stress :   Social Connections:   . Frequency of Communication with Friends and Family:   . Frequency of Social Gatherings with Friends and Family:   . Attends Religious Services:   . Active Member of Clubs or Organizations:   . Attends Archivist Meetings:   Marland Kitchen Marital Status:   Intimate Partner Violence:   . Fear of Current or Ex-Partner:   . Emotionally Abused:   Marland Kitchen Physically Abused:   . Sexually Abused:      PHYSICAL EXAM:  VS: BP 136/86   Pulse 61   Ht 5\' 10"  (1.778 m)   Wt 178 lb (80.7 kg)   BMI 25.54 kg/m  Physical Exam Gen: NAD, alert, cooperative with exam, well-appearing MSK:  Left foot: Swelling and pain to palpation over the distal phalanx of the  left great toe. Normal flexion extension of the first MTP joint. Neurovascularly intact     ASSESSMENT & PLAN:   Closed nondisplaced fracture of distal phalanx of left great toe Injury occurred on 5/8.  Ecchymosis has improved.  Pain is still ongoing. -Counseled on supportive care. -Can continue the postop shoe. -Once pain is improved he can try to wean out of the postop shoe and start buddy tape. - reimage follow-up.

## 2020-05-13 ENCOUNTER — Ambulatory Visit (INDEPENDENT_AMBULATORY_CARE_PROVIDER_SITE_OTHER): Payer: Commercial Managed Care - PPO | Admitting: Family Medicine

## 2020-05-13 ENCOUNTER — Encounter: Payer: Self-pay | Admitting: Family Medicine

## 2020-05-13 ENCOUNTER — Other Ambulatory Visit: Payer: Self-pay

## 2020-05-13 ENCOUNTER — Ambulatory Visit (HOSPITAL_BASED_OUTPATIENT_CLINIC_OR_DEPARTMENT_OTHER)
Admission: RE | Admit: 2020-05-13 | Discharge: 2020-05-13 | Disposition: A | Discharge status: Home / Self Care | Payer: Commercial Managed Care - PPO | Source: Ambulatory Visit | Attending: Family Medicine | Admitting: Family Medicine

## 2020-05-13 VITALS — BP 130/82 | HR 66 | Ht 70.0 in | Wt 180.0 lb

## 2020-05-13 DIAGNOSIS — S92425D Nondisplaced fracture of distal phalanx of left great toe, subsequent encounter for fracture with routine healing: Secondary | ICD-10-CM

## 2020-05-13 NOTE — Patient Instructions (Signed)
Good to see you Please use ice as needed  Please continue the post op shoe until your pain is moderate. You can discontinue as your pain improves  I will call with the results from today   Please send me a message in MyChart with any questions or updates.  Please see me back in 4 weeks.   --Dr. Raeford Razor

## 2020-05-13 NOTE — Progress Notes (Signed)
William Rasmussen - 44 y.o. male MRN [id number]  Date of birth: [birth year]  SUBJECTIVE:  Including CC & ROS.  Chief Complaint  Patient presents with  . Follow-up    left great toe    William Rasmussen is a 44 y.o. male that is following up for his left great toe fracture.  He walked around barefoot a day and that seemed to exacerbate his pain.  He has been using a postop shoe and continuing to buddy tape.   Review of Systems See HPI   HISTORY: Past Medical, Surgical, Social, and Family History Reviewed & Updated per EMR.   Pertinent Historical Findings include:  Past Medical History:  Diagnosis Date  . Family history of colon cancer    mother age 44   . IBS (irritable bowel syndrome)     Past Surgical History:  Procedure Laterality Date  . TONSILLECTOMY    . WISDOM TOOTH EXTRACTION      Family History  Problem Relation Age of Onset  . Colon cancer Mother 66  . Colon polyps Mother   . Esophageal cancer Neg Hx   . Rectal cancer Neg Hx   . Stomach cancer Neg Hx     Social History   Socioeconomic History  . Marital status: Married    Spouse name: Not on file  . Number of children: Not on file  . Years of education: Not on file  . Highest education level: Not on file  Occupational History  . Not on file  Tobacco Use  . Smoking status: Never Smoker  . Smokeless tobacco: Never Used  Substance and Sexual Activity  . Alcohol use: Yes    Comment: occassionally has a couple of beers.  . Drug use: Not Currently  . Sexual activity: Not on file  Other Topics Concern  . Not on file  Social History Narrative  . Not on file   Social Determinants of Health   Financial Resource Strain:   . Difficulty of Paying Living Expenses:   Food Insecurity:   . Worried About Charity fundraiser in the Last Year:   . Arboriculturist in the Last Year:   Transportation Needs:   . Film/video editor (Medical):   Marland Kitchen Lack of Transportation (Non-Medical):   Physical Activity:   .  Days of Exercise per Week:   . Minutes of Exercise per Session:   Stress:   . Feeling of Stress :   Social Connections:   . Frequency of Communication with Friends and Family:   . Frequency of Social Gatherings with Friends and Family:   . Attends Religious Services:   . Active Member of Clubs or Organizations:   . Attends Archivist Meetings:   Marland Kitchen Marital Status:   Intimate Partner Violence:   . Fear of Current or Ex-Partner:   . Emotionally Abused:   Marland Kitchen Physically Abused:   . Sexually Abused:      PHYSICAL EXAM:  VS: BP 130/82   Pulse 66   Ht 5\' 10"  (1.778 m)   Wt 180 lb (81.6 kg)   BMI 25.83 kg/m  Physical Exam Gen: NAD, alert, cooperative with exam, well-appearing MSK:  Left foot: Left great toe with mild swelling over the distal phalanx. Limited flexion extension of the great toe. Tenderness to palpation over the distal phalanx. Neurovascularly intact     ASSESSMENT & PLAN:   Closed nondisplaced fracture of distal phalanx of left great toe Injury occurred on  5/8.  Swelling has improved.  Pain is exacerbated with walker and barefoot so has been using a postop shoe again. -Counseled on supportive care. -Counseled on weaning out of the postop shoe. -X-ray. -Counseled on buddy taping.

## 2020-05-13 NOTE — Assessment & Plan Note (Signed)
Injury occurred on 5/8.  Swelling has improved.  Pain is exacerbated with walker and barefoot so has been using a postop shoe again. -Counseled on supportive care. -Counseled on weaning out of the postop shoe. -X-ray. -Counseled on buddy taping.

## 2020-05-14 ENCOUNTER — Telehealth: Payer: Self-pay | Admitting: Family Medicine

## 2020-05-14 ENCOUNTER — Ambulatory Visit: Payer: Commercial Managed Care - PPO | Admitting: Family Medicine

## 2020-05-14 NOTE — Telephone Encounter (Signed)
Left VM for patient. If he calls back please have him speak with a nurse/CMA and inform that his xray is showing healing. Continue with the plan that we discussed.   If any questions then please take the best time and phone number to call and I will try to call him back.   Rosemarie Ax, MD [facility] 05/14/2020, 8:42 AM

## 2020-06-10 ENCOUNTER — Other Ambulatory Visit: Payer: Self-pay

## 2020-06-10 ENCOUNTER — Ambulatory Visit (INDEPENDENT_AMBULATORY_CARE_PROVIDER_SITE_OTHER): Payer: Commercial Managed Care - PPO | Admitting: Family Medicine

## 2020-06-10 ENCOUNTER — Encounter: Payer: Self-pay | Admitting: Family Medicine

## 2020-06-10 DIAGNOSIS — S92425D Nondisplaced fracture of distal phalanx of left great toe, subsequent encounter for fracture with routine healing: Secondary | ICD-10-CM | POA: Diagnosis not present

## 2020-06-10 NOTE — Progress Notes (Signed)
[name] - 44 y.o. male MRN [id number]  Date of birth: 1976-02-15  SUBJECTIVE:  Including CC & ROS.  Chief Complaint  Patient presents with  . Follow-up    left Great Toe    William Rasmussen is a 44 y.o. male that is following up for his left great toe pain.  He has been doing well and denies any problems.  He is regaining his range of motion slowly.   Review of Systems See HPI   HISTORY: Past Medical, Surgical, Social, and Family History Reviewed & Updated per EMR.   Pertinent Historical Findings include:  Past Medical History:  Diagnosis Date  . Family history of colon cancer    mother age 58   . IBS (irritable bowel syndrome)     Past Surgical History:  Procedure Laterality Date  . TONSILLECTOMY    . WISDOM TOOTH EXTRACTION      Family History  Problem Relation Age of Onset  . Colon cancer Mother 19  . Colon polyps Mother   . Esophageal cancer Neg Hx   . Rectal cancer Neg Hx   . Stomach cancer Neg Hx     Social History   Socioeconomic History  . Marital status: Married    Spouse name: Not on file  . Number of children: Not on file  . Years of education: Not on file  . Highest education level: Not on file  Occupational History  . Not on file  Tobacco Use  . Smoking status: Never Smoker  . Smokeless tobacco: Never Used  Vaping Use  . Vaping Use: Never used  Substance and Sexual Activity  . Alcohol use: Yes    Comment: occassionally has a couple of beers.  . Drug use: Not Currently  . Sexual activity: Not on file  Other Topics Concern  . Not on file  Social History Narrative  . Not on file   Social Determinants of Health   Financial Resource Strain:   . Difficulty of Paying Living Expenses:   Food Insecurity:   . Worried About Charity fundraiser in the Last Year:   . Arboriculturist in the Last Year:   Transportation Needs:   . Film/video editor (Medical):   Marland Kitchen Lack of Transportation (Non-Medical):   Physical Activity:   . Days  of Exercise per Week:   . Minutes of Exercise per Session:   Stress:   . Feeling of Stress :   Social Connections:   . Frequency of Communication with Friends and Family:   . Frequency of Social Gatherings with Friends and Family:   . Attends Religious Services:   . Active Member of Clubs or Organizations:   . Attends Archivist Meetings:   Marland Kitchen Marital Status:   Intimate Partner Violence:   . Fear of Current or Ex-Partner:   . Emotionally Abused:   Marland Kitchen Physically Abused:   . Sexually Abused:      PHYSICAL EXAM:  VS: BP 120/83   Pulse (!) 57   Ht 5\' 10"  (1.778 m)   Wt 180 lb (81.6 kg)   BMI 25.83 kg/m  Physical Exam Gen: NAD, alert, cooperative with exam, well-appearing     ASSESSMENT & PLAN:   Closed nondisplaced fracture of distal phalanx of left great toe Initial injury on 5/8.  No pain today and has regained some of his function. -Counseled on home exercise therapy and supportive care. -Can follow-up as needed.

## 2020-06-10 NOTE — Assessment & Plan Note (Signed)
Initial injury on 5/8.  No pain today and has regained some of his function. -Counseled on home exercise therapy and supportive care. -Can follow-up as needed.

## 2020-12-11 ENCOUNTER — Encounter: Payer: Self-pay | Admitting: Family Medicine

## 2020-12-11 NOTE — Addendum Note (Signed)
Addended by: Lake Bells on: 12/11/2020 11:02 AM   Modules accepted: Orders

## 2020-12-29 ENCOUNTER — Encounter: Payer: Self-pay | Admitting: Family Medicine

## 2020-12-30 ENCOUNTER — Encounter: Payer: Self-pay | Admitting: Family Medicine

## 2020-12-30 ENCOUNTER — Telehealth (INDEPENDENT_AMBULATORY_CARE_PROVIDER_SITE_OTHER): Payer: Commercial Managed Care - PPO | Admitting: Family Medicine

## 2020-12-30 VITALS — Wt 185.0 lb

## 2020-12-30 DIAGNOSIS — U071 COVID-19: Secondary | ICD-10-CM | POA: Diagnosis not present

## 2020-12-30 NOTE — Progress Notes (Signed)
  [facility] [address] [place] [address] Dept: [phone] Dept Fax: [phone]  Telephone Visit  I connected with William Rasmussen on 12/30/20 at  3:00 PM EST by telephone and verified that I am speaking with the correct person using two identifiers. We utilized the telephone, as his smart phone was unable to connect for a video visit.  Location patient: Home Location provider: Clinic Persons participating in the virtual visit: Patient, Provider  I discussed the limitations of evaluation and management by telemedicine and the availability of in person appointments. The patient expressed understanding and agreed to proceed.  Chief Complaint  Patient presents with  . Acute Visit    C/o having a cough/congestion, ST, nasal congestion x 3 days.  Had fever 1 day ago.  Tested positive for covid on 12/29/20. He has been taking Dayquil an Nyquill with little relief.     SUBJECTIVE:  HPI: William Rasmussen is a 45 y.o. male who presents with Signs of illness starting on 12/27/2020. He notes he had subjective fever, night sweats, intense fatigue, myalgias, nasal/chest congestion, mild HA, and mild dyspnea. He denies sore throat, confusion, N/V/D or loss of smell/taste. His urine output has been good and his urine is clear. He did previously have the Ferdinand vaccine in March and April 2021.  Past Medical History:  Diagnosis Date  . Family history of colon cancer    mother age 30   . IBS (irritable bowel syndrome)     Past Surgical History:  Procedure Laterality Date  . TONSILLECTOMY    . WISDOM TOOTH EXTRACTION      Family History  Problem Relation Age of Onset  . Colon cancer Mother 24  . Colon polyps Mother   . Esophageal cancer Neg Hx   . Rectal cancer Neg Hx   . Stomach cancer Neg Hx     Social History   Tobacco Use  . Smoking status: Never Smoker  . Smokeless tobacco: Never Used  Vaping Use  . Vaping  Use: Never used  Substance Use Topics  . Alcohol use: Yes    Comment: occassionally has a couple of beers.  . Drug use: Not Currently   No current outpatient medications on file.  No Known Allergies  ROS: See pertinent positives and negatives per HPI.  ASSESSMENT AND PLAN:  1. COVID-19 - Based on subjective findings, Mr. Hallum appears to be appropriate for home management of his COVID-19 infection. - Continue OTC symptomatic treatments. Consider adding either acetaminophen or ibuprofen, if not included in current OTC. - Consider trial of prone positioning at night to see if improves breathing during sleep. - Monitor for worsening of dyspnea, esp. over the next 4-5 days. Seek immediate care if this occurs.   I discussed the assessment and treatment plan with the patient. The patient was provided an opportunity to ask questions and all were answered. The patient agreed with the plan and demonstrated an understanding of the instructions.   The patient was advised to call back or seek an in-person evaluation if the symptoms worsen or if the condition fails to improve as anticipated.  I spent 25 minutes on this telephone encounter.   Haydee Salter, MD

## 2021-01-13 ENCOUNTER — Ambulatory Visit: Payer: Commercial Managed Care - PPO | Admitting: Family Medicine

## 2021-01-15 ENCOUNTER — Ambulatory Visit (INDEPENDENT_AMBULATORY_CARE_PROVIDER_SITE_OTHER): Payer: Commercial Managed Care - PPO | Admitting: Family Medicine

## 2021-01-15 ENCOUNTER — Other Ambulatory Visit: Payer: Self-pay

## 2021-01-15 ENCOUNTER — Encounter: Payer: Self-pay | Admitting: Family Medicine

## 2021-01-15 ENCOUNTER — Telehealth: Payer: Commercial Managed Care - PPO | Admitting: Nurse Practitioner

## 2021-01-15 VITALS — BP 115/68 | HR 67 | Temp 96.9°F | Ht 69.0 in | Wt 181.0 lb

## 2021-01-15 DIAGNOSIS — Z23 Encounter for immunization: Secondary | ICD-10-CM

## 2021-01-15 DIAGNOSIS — S40022A Contusion of left upper arm, initial encounter: Secondary | ICD-10-CM

## 2021-01-15 DIAGNOSIS — Z0000 Encounter for general adult medical examination without abnormal findings: Secondary | ICD-10-CM | POA: Diagnosis not present

## 2021-01-15 NOTE — Progress Notes (Signed)
  [facility] [address] [place] [address] Dept: [phone] Dept Fax: [phone]  Acute Office Visit  Subjective:    Patient ID: Ailene Ards, male    DOB: 02-Apr-1976, 45 y.o..   MRN: [id number]  Chief Complaint  Patient presents with  . Annual Exam    CPE, no concerns. Patient fasting for labs.     History of Present Illness:  Patient is in today for an annual wellness visit. He states that his health is currently good. 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 Mr. Harte is married with 2 children (1 and 30 yo). He notes he has an appointment next week for a vasectomy [facility]. He does not smoke. He drinks socially. There is no history of drug use. There is a family history of CLL (father) and colon cancer (mother). He had a colonoscopy last year. There was a single, non-cancerous polyp that was removed.  Past Medical History: Patient Active Problem List   Diagnosis Date Noted  . Strain of great toe, left, initial encounter 04/22/2020  . Closed nondisplaced fracture of distal phalanx of left great toe 04/22/2020  . Healthcare maintenance 05/25/2019  . Family history of colon cancer in mother 05/25/2019  . Multiple atypical nevi 05/25/2019   Past Surgical History:  Procedure Laterality Date  . TONSILLECTOMY    . WISDOM TOOTH EXTRACTION      Family History  Problem Relation Age of Onset  . Colon cancer Mother 38  . Colon polyps Mother   . Esophageal cancer Neg Hx   . Rectal cancer Neg Hx   . Stomach cancer Neg Hx    No outpatient medications prior to visit.   No facility-administered medications prior to visit.   No Known Allergies    Objective:   Today's Vitals   01/15/21 0835  BP: 115/68  Pulse: 67  Temp: (!) 96.9 F (36.1 C)  TempSrc: Temporal  SpO2: 98%  Weight: 181 lb (82.1 kg)  Height: 5\' 9"  (1.753 m)    Body mass index is 26.73 kg/m.   General: Well developed, well nourished. No acute distress. HEENT: Normocephalic, non-traumatic. PERRL, EOMI. Conjunctiva clear. Fundiscopic exam shows   normal disc and vasculature. External ears normal. EAC and TMs normal bilaterally. Nose clear without   congestion or rhinorrhea. Mucous membranes moist. Oropharynx clear. Good dentition. Neck: Supple. No lymphadenopathy. No thyromegaly. Lungs: Clear to auscultation bilaterally. CV: RRR without murmurs or rubs. Pulses 2+ bilaterally. Abdomen: Soft, non-tender. No hepatosplenomegaly. No rebound or guarding. Extremities: Full ROM. No joint swelling or tenderness. No edema noted. Skin: Warm and dry. No rashes. Multiple brown nevi noted. Neuro:CN II-XII intact. Normal sensation and DTR bilaterally. Psych: Alert and oriented x3. Normal mood and affect.  Health Maintenance Due  Topic Date Due  . Hepatitis C Screening  Never done  . TETANUS/TDAP  Never done   Assessment & Plan:   1. Routine general medical examination at a health care facility Mr. Tarango is in good health. Routine preventive guidance was provided. His last lipid was in 2020 and was normal. This will be due to repeat in 2025. He will be due for his repeat colonoscopy in 2026. He cannot remember his last Td, so we will provide this today. Follow-up in 1 year.  Haydee Salter, MD

## 2021-01-15 NOTE — Progress Notes (Signed)
Virtual Visit Progress Note  Mr. javonn, gauger are scheduled for a virtual visit with your provider today.    Just as we do with appointments in the office, we must obtain your consent to participate.  Your consent will be active for this visit and any virtual visit you may have with one of our providers in the next 365 days.    If you have a MyChart account, I can also send a copy of this consent to you electronically.  All virtual visits are billed to your insurance company just like a traditional visit in the office.  As this is a virtual visit, video technology does not allow for your provider to perform a traditional examination.  This may limit your provider's ability to fully assess your condition.  If your provider identifies any concerns that need to be evaluated in person or the need to arrange testing such as labs, EKG, etc, we will make arrangements to do so.    Although advances in technology are sophisticated, we cannot ensure that it will always work on either your end or our end.  If the connection with a video visit is poor, we may have to switch to a telephone visit.  With either a video or telephone visit, we are not always able to ensure that we have a secure connection.   I need to obtain your verbal consent now.   Are you willing to proceed with your visit today?   DEAUNDRA KUTZER has provided verbal consent on 01/15/2021 for a virtual visit (video or telephone).   Verlon Au, NP 01/15/2021  7:09 PM    I connected with Ailene Ards on 01/15/21 at  7:15 PM EST by video enabled telemedicine visit and verified that I am speaking with the correct person using two identifiers.   I discussed the limitations, risks, security and privacy concerns of performing an evaluation and management service by telemedicine and the availability of in-person appointments. I also discussed with the patient that there may be a patient responsible charge related to this service. The patient  expressed understanding and agreed to proceed.   Video failed and we converted to a telephone visit.   Other persons participating in the visit and their role in the encounter: none  Patient's location: home Provider's location: home  Chief Complaint: bruising and pain post immunization    Patient Care Team: Libby Maw, MD as PCP - General (Family Medicine)   Name of the patient: William Rasmussen  [id number]  [birth year]   Date of visit: 01/15/21  History of Presenting Illness-patient is a healthy 45 year old male who was seen by his PCP today for wellness visit and received Tdap immunization.  Later today he noticed generalized soreness in the arm where he received the immunization and his wife commented on bruising on the underside of his upper arm. He denies injury. Has an active job and is an active father but doesn't recall any injuries. Area is tender to touch. He questions adverse reaction to the tdap shot and if further evaluation is necessary. Video failed but he was able to send pictures of the area via mychart. Doesn't feel he has a history of abnormal bleeding or bruising. No fevers or chills. No new lumps or bumps. No other bruises reported.   Review of systems- Review of Systems  Constitutional: Negative for chills, diaphoresis, fever, malaise/fatigue and weight loss.  HENT: Negative for congestion, ear discharge, ear pain, hearing loss, nosebleeds, sinus pain,  sore throat and tinnitus.   Eyes: Negative for blurred vision, double vision, pain and redness.  Respiratory: Negative for cough, hemoptysis, sputum production, shortness of breath, wheezing and stridor.   Cardiovascular: Negative for chest pain, palpitations and leg swelling.  Gastrointestinal: Negative for abdominal pain, blood in stool, constipation, diarrhea, melena, nausea and vomiting.  Genitourinary: Negative for dysuria and urgency.  Musculoskeletal: Negative for back pain, falls, joint pain and  myalgias.  Skin: Negative for itching and rash.  Neurological: Negative for dizziness, tingling, sensory change, loss of consciousness, weakness and headaches.  Endo/Heme/Allergies: Negative for environmental allergies. Does not bruise/bleed easily.  Psychiatric/Behavioral: Negative for depression. The patient is not nervous/anxious and does not have insomnia.   All other systems reviewed and are negative.    No Known Allergies  Past Medical History:  Diagnosis Date  . Family history of colon cancer    mother age 68   . IBS (irritable bowel syndrome)     Past Surgical History:  Procedure Laterality Date  . TONSILLECTOMY    . WISDOM TOOTH EXTRACTION      Social History   Socioeconomic History  . Marital status: Married    Spouse name: Not on file  . Number of children: Not on file  . Years of education: Not on file  . Highest education level: Not on file  Occupational History  . Not on file  Tobacco Use  . Smoking status: Never Smoker  . Smokeless tobacco: Never Used  Vaping Use  . Vaping Use: Never used  Substance and Sexual Activity  . Alcohol use: Yes    Comment: occassionally has a couple of beers.  . Drug use: Not Currently  . Sexual activity: Yes  Other Topics Concern  . Not on file  Social History Narrative  . Not on file   Social Determinants of Health   Financial Resource Strain: Not on file  Food Insecurity: Not on file  Transportation Needs: Not on file  Physical Activity: Not on file  Stress: Not on file  Social Connections: Not on file  Intimate Partner Violence: Not on file    Immunization History  Administered Date(s) Administered  . PFIZER Comirnaty(Gray Top)Covid-19 Tri-Sucrose Vaccine 02/04/2020, 03/06/2020  . PFIZER(Purple Top)SARS-COV-2 Vaccination 12/15/2020  . Tdap 01/15/2021    Family History  Problem Relation Age of Onset  . Colon cancer Mother 87  . Colon polyps Mother   . Esophageal cancer Neg Hx   . Rectal cancer Neg Hx    . Stomach cancer Neg Hx     No current outpatient medications on file.  Physical exam: Exam limited due to telemedicine Physical Exam Neurological:     Mental Status: He is alert and oriented to person, place, and time.  Psychiatric:        Mood and Affect: Mood normal.        Behavior: Behavior normal.       Assessment and plan- Patient is a 45 y.o. male who presents for bruising. Symptoms unlikely related to tdap given location. Bruising appears in various stages of healing and likely not acute. With single, unexplained bruise, unlikely to represent underlying bleeding diathesis though advised him to monitor. Can take tylenol and apply ice packs for comfort. If symptoms worsen, notify pcp for follow up.    Visit Diagnosis 1. Superficial bruising of arm, left, initial encounter     Patient expressed understanding and was in agreement with this plan. He also understands that He can call clinic  at any time with any questions, concerns, or complaints.   I discussed the assessment and treatment plan with the patient. The patient was provided an opportunity to ask questions and all were answered. The patient agreed with the plan and demonstrated an understanding of the instructions.   The patient was advised to call back or seek an in-person evaluation if the symptoms worsen or if the condition fails to improve as anticipated.   I spent 10 minutes on this telephone encounter. Video connection was lost at >50% of the duration of the visit, at which time the remainder of the visit was completed via audio only.  Thank you for allowing me to participate in the care of this very pleasant patient.   Beckey Rutter, DNP, AGNP-C [facility]

## 2021-01-15 NOTE — Patient Instructions (Signed)

## 2021-07-21 ENCOUNTER — Ambulatory Visit (INDEPENDENT_AMBULATORY_CARE_PROVIDER_SITE_OTHER): Payer: Commercial Managed Care - PPO | Admitting: Family Medicine

## 2021-07-21 ENCOUNTER — Other Ambulatory Visit: Payer: Self-pay

## 2021-07-21 ENCOUNTER — Encounter: Payer: Self-pay | Admitting: Family Medicine

## 2021-07-21 VITALS — BP 114/70 | HR 70 | Temp 97.0°F | Ht 69.0 in | Wt 173.0 lb

## 2021-07-21 DIAGNOSIS — H6123 Impacted cerumen, bilateral: Secondary | ICD-10-CM | POA: Insufficient documentation

## 2021-07-21 DIAGNOSIS — H6502 Acute serous otitis media, left ear: Secondary | ICD-10-CM | POA: Insufficient documentation

## 2021-07-21 DIAGNOSIS — H60502 Unspecified acute noninfective otitis externa, left ear: Secondary | ICD-10-CM | POA: Diagnosis not present

## 2021-07-21 DIAGNOSIS — H6982 Other specified disorders of Eustachian tube, left ear: Secondary | ICD-10-CM

## 2021-07-21 MED ORDER — AMOXICILLIN 875 MG PO TABS: 875.0000 mg | ORAL_TABLET | Freq: Two times a day (BID) | ORAL | 0 refills | Status: AC

## 2021-07-21 MED ORDER — NEOMYCIN-POLYMYXIN-HC 3.5-10000-1 OT SUSP: 3.0000 [drp] | Freq: Three times a day (TID) | OTIC | 0 refills | Status: AC

## 2021-07-21 NOTE — Progress Notes (Signed)
Established Patient Office Visit  Subjective:  Patient ID: William Rasmussen, male    DOB: 01/23/1976  Age: 45 y.o. MRN: [id number]  CC:  Chief Complaint  Patient presents with   Ear Fullness    Left painful unable to hear well symptoms x 2 days.     HPI William Rasmussen presents for a 1 day history of pain and acute hearing loss left ear.  Denies any recent postnasal drip nasal congestion sneezing watery eyes ears nose throat.  There have been no fevers.  No history of wax or earplug use.  Family over the weekend a few days ago.  No history of otitis media as no medicines for this.  He could not get his [place] to [place]pop[place]  Past Medical History:  Diagnosis Date   Family history of colon cancer    mother age 33    IBS (irritable bowel syndrome)     Past Surgical History:  Procedure Laterality Date   TONSILLECTOMY     WISDOM TOOTH EXTRACTION      Family History  Problem Relation Age of Onset   Colon cancer Mother 65   Colon polyps Mother    Esophageal cancer Neg Hx    Rectal cancer Neg Hx    Stomach cancer Neg Hx     Social History   Socioeconomic History   Marital status: Married    Spouse name: Not on file   Number of children: Not on file   Years of education: Not on file   Highest education level: Not on file  Occupational History   Not on file  Tobacco Use   Smoking status: Never   Smokeless tobacco: Never  Vaping Use   Vaping Use: Never used  Substance and Sexual Activity   Alcohol use: Yes    Comment: occassionally has a couple of beers.   Drug use: Not Currently   Sexual activity: Yes  Other Topics Concern   Not on file  Social History Narrative   Not on file   Social Determinants of Health   Financial Resource Strain: Not on file  Food Insecurity: Not on file  Transportation Needs: Not on file  Physical Activity: Not on file  Stress: Not on file  Social Connections: Not on file  Intimate Partner Violence:  Not on file    No outpatient medications prior to visit.   No facility-administered medications prior to visit.    No Known Allergies  ROS Review of Systems  Constitutional:  Negative for chills, diaphoresis, fatigue, fever and unexpected weight change.  HENT:  Positive for ear pain, hearing loss, postnasal drip and sneezing. Negative for congestion and ear discharge.   Eyes:  Negative for photophobia, discharge, itching and visual disturbance.  Respiratory:  Negative for cough.   Gastrointestinal:  Negative for nausea and vomiting.  Musculoskeletal:  Negative for arthralgias and myalgias.  Neurological:  Negative for headaches.     Objective:    Physical Exam Vitals and nursing note reviewed.  Constitutional:      General: He is not in acute distress.    Appearance: Normal appearance. He is normal weight. He is not ill-appearing, toxic-appearing or diaphoretic.  HENT:     Head: Normocephalic and atraumatic.     Right Ear: Tympanic membrane, ear canal and external ear normal.     Left Ear: A middle ear effusion is present. Tympanic membrane is injected, erythematous and bulging. Tympanic membrane is  not perforated.     Ears:      Mouth/Throat:     Mouth: Mucous membranes are moist.     Pharynx: Oropharynx is clear. No oropharyngeal exudate or posterior oropharyngeal erythema.  Eyes:     General: No scleral icterus.       Right eye: No discharge.        Left eye: No discharge.     Extraocular Movements: Extraocular movements intact.     Conjunctiva/sclera: Conjunctivae normal.     Pupils: Pupils are equal, round, and reactive to light.  Pulmonary:     Effort: Pulmonary effort is normal.  Musculoskeletal:     Cervical back: No rigidity or tenderness.     Right lower leg: No edema.     Left lower leg: No edema.  Lymphadenopathy:     Cervical: No cervical adenopathy.  Skin:    General: Skin is warm and dry.  Neurological:     Mental Status: He is alert and oriented  to person, place, and time.  Psychiatric:        Mood and Affect: Mood normal.        Behavior: Behavior normal.    BP 114/70 (BP Location: Right Arm, Patient Position: Sitting, Cuff Size: Normal)   Pulse 70   Temp (!) 97 F (36.1 C) (Temporal)   Ht '5\' 9"'$  (1.753 m)   Wt 173 lb (78.5 kg)   SpO2 97%   BMI 25.55 kg/m  Wt Readings from Last 3 Encounters:  07/21/21 173 lb (78.5 kg)  01/15/21 181 lb (82.1 kg)  12/30/20 185 lb (83.9 kg)     Health Maintenance Due  Topic Date Due   Pneumococcal Vaccine 81-52 Years old (1 - PCV) Never done   Hepatitis C Screening  Never done   INFLUENZA VACCINE  07/06/2021    There are no preventive care reminders to display for this patient.  No results found for: TSH Lab Results  Component Value Date   WBC 5.1 06/01/2019   HGB 14.7 06/01/2019   HCT 44.1 06/01/2019   MCV 101.0 (H) 06/01/2019   PLT 232.0 06/01/2019   Lab Results  Component Value Date   NA 142 06/01/2019   K 4.2 06/01/2019   CO2 30 06/01/2019   GLUCOSE 90 06/01/2019   BUN 12 06/01/2019   CREATININE 0.89 06/01/2019   BILITOT 1.3 (H) 06/01/2019   ALKPHOS 61 06/01/2019   AST 18 06/01/2019   ALT 19 06/01/2019   PROT 6.6 06/01/2019   ALBUMIN 4.3 06/01/2019   CALCIUM 9.1 06/01/2019   GFR 94.24 06/01/2019   Lab Results  Component Value Date   CHOL 162 06/01/2019   Lab Results  Component Value Date   HDL 39.30 06/01/2019   Lab Results  Component Value Date   LDLCALC 99 06/01/2019   Lab Results  Component Value Date   TRIG 119.0 06/01/2019   Lab Results  Component Value Date   CHOLHDL 4 06/01/2019   No results found for: HGBA1C    Assessment & Plan:   Problem List Items Addressed This Visit       Nervous and Auditory   Dysfunction of left eustachian tube   Excessive cerumen in both ear canals   Acute serous otitis media of left ear - Primary   Relevant Medications   amoxicillin (AMOXIL) 875 MG tablet   Other Visit Diagnoses     Acute otitis  externa of left ear, unspecified type  Relevant Medications   neomycin-polymyxin-hydrocortisone (CORTISPORIN) 3.5-10000-1 OTIC suspension       Meds ordered this encounter  Medications   amoxicillin (AMOXIL) 875 MG tablet    Sig: Take 1 tablet (875 mg total) by mouth 2 (two) times daily for 10 days.    Dispense:  20 tablet    Refill:  0   neomycin-polymyxin-hydrocortisone (CORTISPORIN) 3.5-10000-1 OTIC suspension    Sig: Place 3 drops into the left ear 3 (three) times daily for 7 days.    Dispense:  10 mL    Refill:  0    Follow-up: Return in about 2 weeks (around 08/04/2021).  Patient will start the amoxicillin and the Cortisporin otic drops.  Hopefully the drops will soften the wax plug that is present in the left canal and relieve the erythema of the canal.  Amoxicillin twice daily for seizures otitis media.  Advised eustachian tube exercises 3 times daily.  Given information on otitis media, eustachian tube dysfunction otitus externa.  Return in 2 weeks or recheck and hopeful ear irrigation.  Libby Maw, MD

## 2021-07-28 ENCOUNTER — Encounter: Payer: Self-pay | Admitting: Family Medicine

## 2021-07-28 ENCOUNTER — Telehealth: Payer: Self-pay | Admitting: Family Medicine

## 2021-07-28 NOTE — Telephone Encounter (Signed)
Appointment scheduled for follow up 

## 2021-07-28 NOTE — Telephone Encounter (Signed)
Pt called in and said that the ear infection that he was seen for on 07/21/21 is still not any better, its not worse but its about the same. He said he still cant hear out of it and didn't know what to do. Please advise

## 2021-07-28 NOTE — Telephone Encounter (Signed)
Please advise message below  °

## 2021-07-29 ENCOUNTER — Ambulatory Visit (INDEPENDENT_AMBULATORY_CARE_PROVIDER_SITE_OTHER): Payer: Commercial Managed Care - PPO | Admitting: Family Medicine

## 2021-07-29 ENCOUNTER — Encounter: Payer: Self-pay | Admitting: Family Medicine

## 2021-07-29 ENCOUNTER — Other Ambulatory Visit: Payer: Self-pay

## 2021-07-29 VITALS — BP 120/72 | HR 56 | Temp 97.5°F | Ht 69.0 in | Wt 173.4 lb

## 2021-07-29 DIAGNOSIS — H6982 Other specified disorders of Eustachian tube, left ear: Secondary | ICD-10-CM | POA: Diagnosis not present

## 2021-07-29 DIAGNOSIS — H6123 Impacted cerumen, bilateral: Secondary | ICD-10-CM | POA: Diagnosis not present

## 2021-07-29 MED ORDER — DEBROX 6.5 % OT SOLN: 5.0000 [drp] | Freq: Two times a day (BID) | OTIC | 0 refills | Status: DC

## 2021-07-29 MED ORDER — METHYLPREDNISOLONE ACETATE 80 MG/ML IJ SUSP
80.0000 mg | Freq: Once | INTRAMUSCULAR | Status: AC
Start: 2021-07-29 — End: 2021-07-29
  Administered 2021-07-29: 80 mg via INTRAMUSCULAR

## 2021-07-29 NOTE — Progress Notes (Signed)
Established Patient Office Visit  Subjective:  Patient ID: William Rasmussen, male    DOB: February 07, 1976  Age: 45 y.o. MRN: [id number]  CC:  Chief Complaint  Patient presents with   Ear Fullness    Left ear still feels clogged up.     HPI William Rasmussen presents for follow-up of left ear serous otitis media with cerumen gnosis.  Discomfort has resolved but hearing congestion persists.  Is running a fevers.  He does not do well with prednisone as it causes nausea for him.  He would prefer not to use a nose spray.  Past Medical History:  Diagnosis Date   Family history of colon cancer    mother age 2    IBS (irritable bowel syndrome)     Past Surgical History:  Procedure Laterality Date   TONSILLECTOMY     WISDOM TOOTH EXTRACTION      Family History  Problem Relation Age of Onset   Colon cancer Mother 32   Colon polyps Mother    Esophageal cancer Neg Hx    Rectal cancer Neg Hx    Stomach cancer Neg Hx     Social History   Socioeconomic History   Marital status: Married    Spouse name: Not on file   Number of children: Not on file   Years of education: Not on file   Highest education level: Not on file  Occupational History   Not on file  Tobacco Use   Smoking status: Never   Smokeless tobacco: Never  Vaping Use   Vaping Use: Never used  Substance and Sexual Activity   Alcohol use: Yes    Comment: occassionally has a couple of beers.   Drug use: Not Currently   Sexual activity: Yes  Other Topics Concern   Not on file  Social History Narrative   Not on file   Social Determinants of Health   Financial Resource Strain: Not on file  Food Insecurity: Not on file  Transportation Needs: Not on file  Physical Activity: Not on file  Stress: Not on file  Social Connections: Not on file  Intimate Partner Violence: Not on file    Outpatient Medications Prior to Visit  Medication Sig Dispense Refill   amoxicillin (AMOXIL) 875 MG tablet Take 1 tablet (875 mg  total) by mouth 2 (two) times daily for 10 days. 20 tablet 0   No facility-administered medications prior to visit.    No Known Allergies  ROS Review of Systems  Constitutional:  Negative for chills, diaphoresis, fatigue, fever and unexpected weight change.  HENT:  Positive for hearing loss and postnasal drip. Negative for congestion, ear discharge and ear pain.   Eyes:  Negative for photophobia and visual disturbance.  Respiratory: Negative.    Psychiatric/Behavioral: Negative.       Objective:    Physical Exam Vitals and nursing note reviewed.  Constitutional:      General: He is not in acute distress.    Appearance: Normal appearance. He is not ill-appearing, toxic-appearing or diaphoretic.  HENT:     Head: Normocephalic and atraumatic.     Right Ear: Tympanic membrane normal.     Left Ear:  No middle ear effusion. Tympanic membrane is retracted. Tympanic membrane is not injected or perforated.     Ears:   Pulmonary:     Effort: Pulmonary effort is normal.  Skin:    General: Skin is warm and dry.  Neurological:     Mental  Status: He is alert and oriented to person, place, and time.    BP 120/72 (BP Location: Right Arm, Patient Position: Sitting, Cuff Size: Normal)   Pulse (!) 56   Temp (!) 97.5 F (36.4 C) (Temporal)   Ht '5\' 9"'$  (1.753 m)   Wt 173 lb 6.4 oz (78.7 kg)   SpO2 98%   BMI 25.61 kg/m  Wt Readings from Last 3 Encounters:  07/29/21 173 lb 6.4 oz (78.7 kg)  07/21/21 173 lb (78.5 kg)  01/15/21 181 lb (82.1 kg)     Health Maintenance Due  Topic Date Due   Pneumococcal Vaccine 92-26 Years old (1 - PCV) Never done   Hepatitis C Screening  Never done   INFLUENZA VACCINE  07/06/2021    There are no preventive care reminders to display for this patient.  No results found for: TSH Lab Results  Component Value Date   WBC 5.1 06/01/2019   HGB 14.7 06/01/2019   HCT 44.1 06/01/2019   MCV 101.0 (H) 06/01/2019   PLT 232.0 06/01/2019   Lab Results   Component Value Date   NA 142 06/01/2019   K 4.2 06/01/2019   CO2 30 06/01/2019   GLUCOSE 90 06/01/2019   BUN 12 06/01/2019   CREATININE 0.89 06/01/2019   BILITOT 1.3 (H) 06/01/2019   ALKPHOS 61 06/01/2019   AST 18 06/01/2019   ALT 19 06/01/2019   PROT 6.6 06/01/2019   ALBUMIN 4.3 06/01/2019   CALCIUM 9.1 06/01/2019   GFR 94.24 06/01/2019   Lab Results  Component Value Date   CHOL 162 06/01/2019   Lab Results  Component Value Date   HDL 39.30 06/01/2019   Lab Results  Component Value Date   LDLCALC 99 06/01/2019   Lab Results  Component Value Date   TRIG 119.0 06/01/2019   Lab Results  Component Value Date   CHOLHDL 4 06/01/2019   No results found for: HGBA1C    Assessment & Plan:   Problem List Items Addressed This Visit       Nervous and Auditory   Dysfunction of left eustachian tube - Primary   Relevant Medications   methylPREDNISolone acetate (DEPO-MEDROL) injection 80 mg (Start on 07/29/2021 10:15 AM)   Excessive cerumen in both ear canals   Relevant Medications   carbamide peroxide (DEBROX) 6.5 % OTIC solution    Meds ordered this encounter  Medications   methylPREDNISolone acetate (DEPO-MEDROL) injection 80 mg   carbamide peroxide (DEBROX) 6.5 % OTIC solution    Sig: Place 5 drops into both ears 2 (two) times daily.    Dispense:  15 mL    Refill:  0    Follow-up: Return in about 10 days (around 08/08/2021), or if symptoms worsen or fail to improve, for AND continue Eustachian Tube exercises three times daily. .   Will start Debrox drops in both ears.  Return in 10 days for irrigation and/or request for ENT referral for second opinion if ears or not improving. Libby Maw, MD

## 2021-08-04 IMAGING — DX DG FOOT COMPLETE 3+V*L*
3 series · 3 of 3 positions shown · non-contrast
Comparison: None.

CLINICAL DATA: Great toe injury 9 days ago that remains painful

EXAM:
LEFT FOOT - COMPLETE 3+ VIEW

[foot ap]
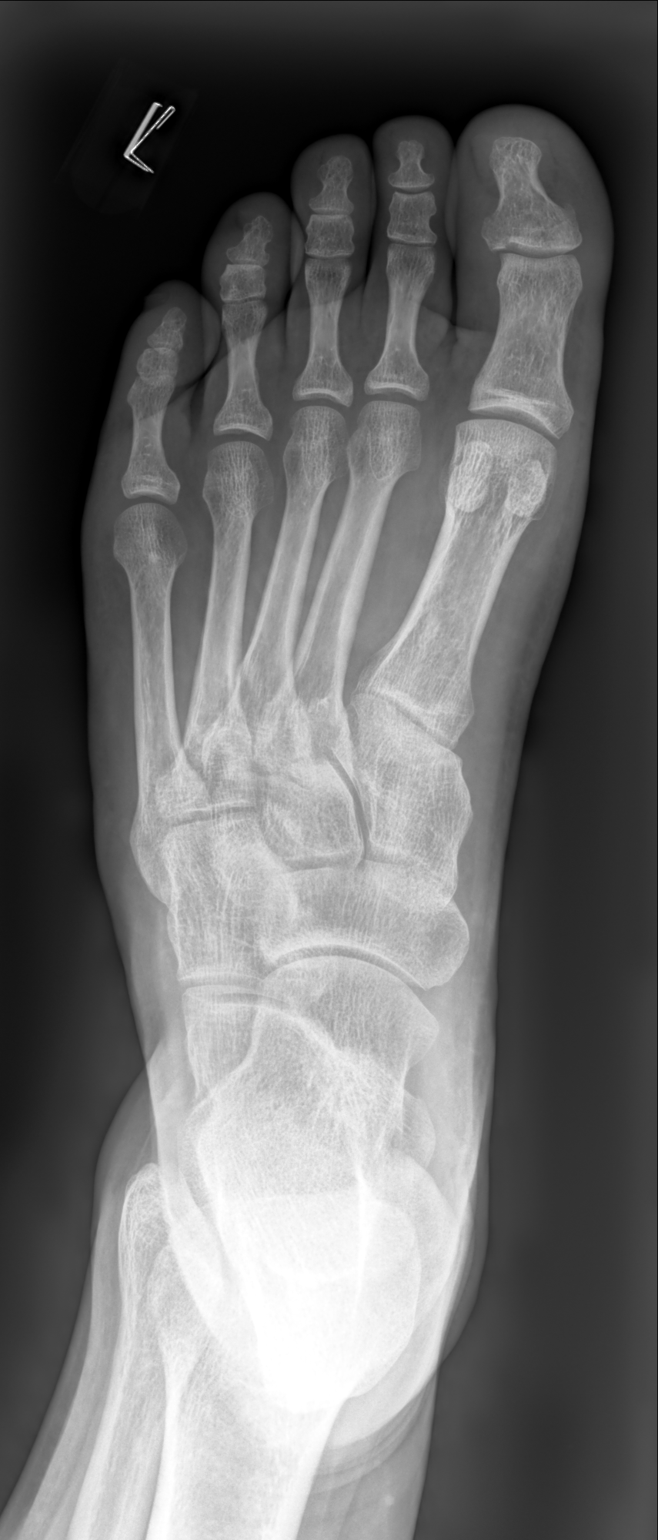

[foot mlo]
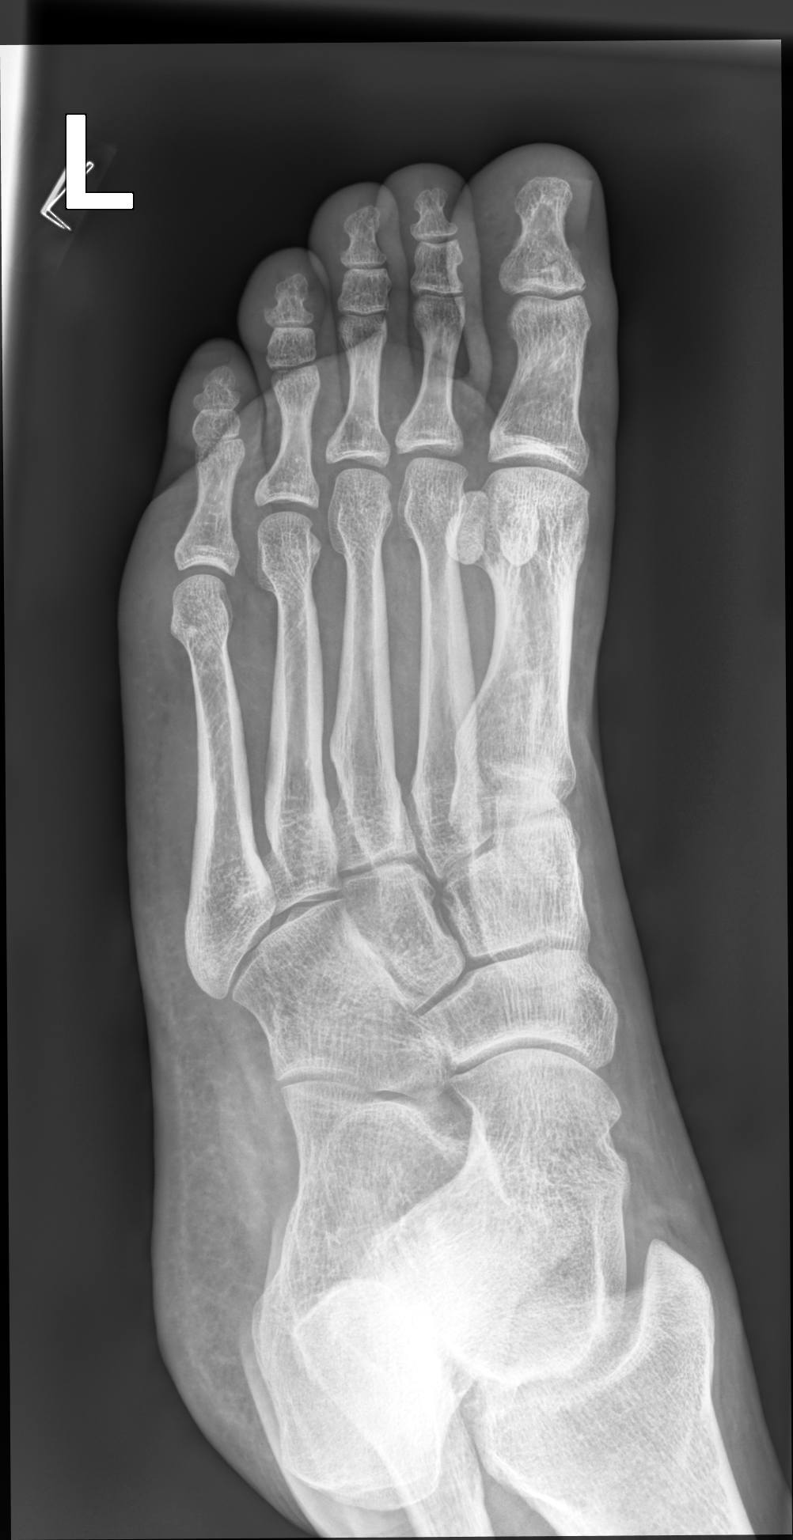

[foot lat]
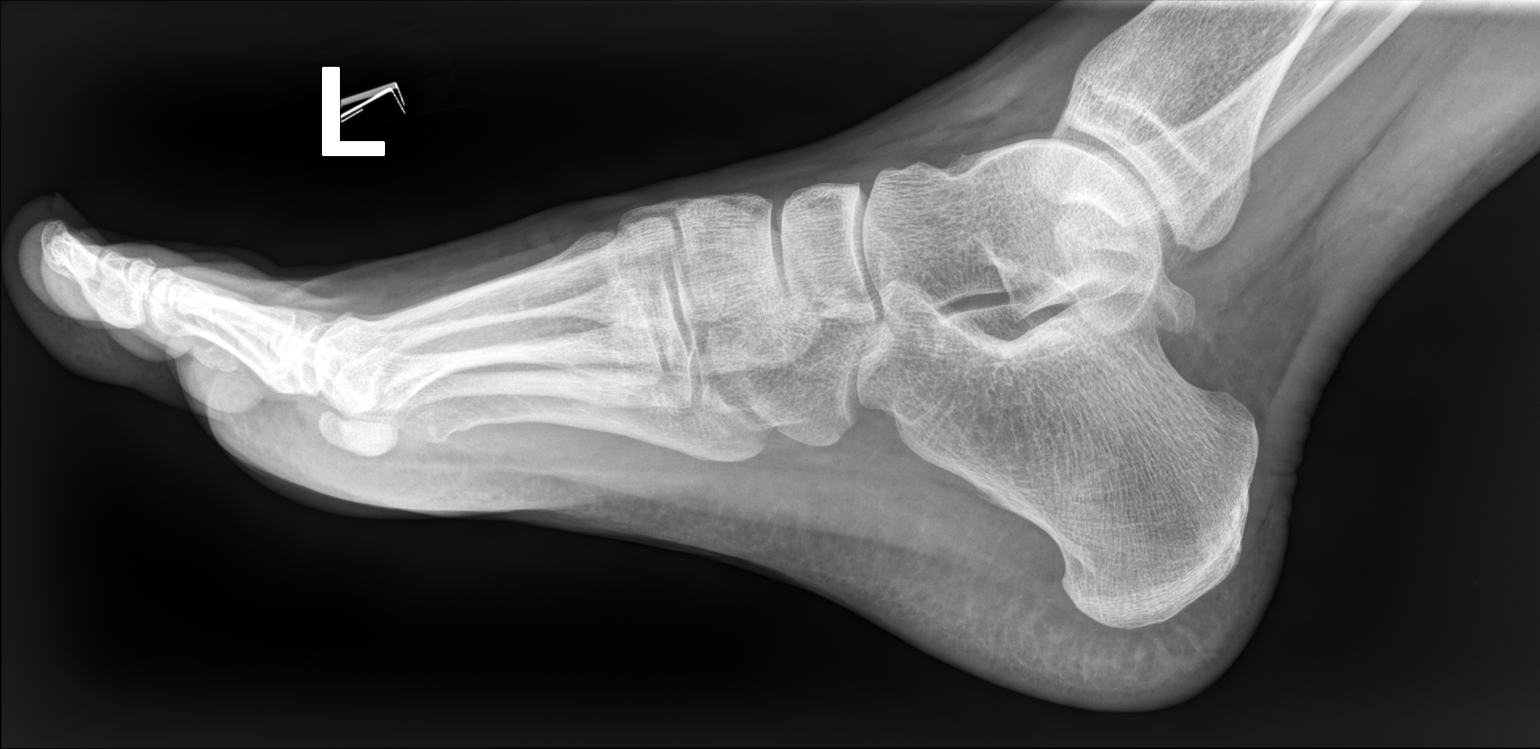

[3 of 3 positions shown; findings below may reference images not displayed]

FINDINGS: Fracture at the lateral and central base of the great toe distal
phalanx, nondisplaced. There is mild cortical offset at the level of
the central IP joint. No dislocation.
IMPRESSION: Nondisplaced fracture at the base of the great toe distal phalanx.

## 2023-02-03 ENCOUNTER — Encounter: Payer: Self-pay | Admitting: Family Medicine

## 2023-02-03 ENCOUNTER — Ambulatory Visit: Payer: 59 | Admitting: Family Medicine

## 2023-02-03 VITALS — BP 116/68 | HR 60 | Temp 97.1°F | Ht 69.0 in | Wt 188.4 lb

## 2023-02-03 DIAGNOSIS — S39011A Strain of muscle, fascia and tendon of abdomen, initial encounter: Secondary | ICD-10-CM

## 2023-02-03 MED ORDER — NAPROXEN 500 MG PO TABS: 500.0000 mg | ORAL_TABLET | Freq: Two times a day (BID) | ORAL | 0 refills | Status: DC

## 2023-02-03 NOTE — Assessment & Plan Note (Signed)
I see no evidence of an acute hernia. Exam consistent with an abdominal wall muscle strain. I recommend he use heat and I will prescribe a 1-week course of naproxen 500 mg bid.

## 2023-02-03 NOTE — Progress Notes (Signed)
  [facility] [address] [place] [address] Dept: [phone] Dept Fax: [phone]  Office Visit  Subjective:    Patient ID: William Rasmussen, male    DOB: 03-Dec-1976, 47 y.o..   MRN: [id number]  Chief Complaint  Patient presents with   Abdominal Pain    C/o having pain in LT abdomen x 1 week.  Lifted a large flower pot.   No OTC meds taken    History of Present Illness:  Patient is in today complaining of a a 1-week pain in his left lower abdomen. he notes that this started suddenly after lifting a large potted plant at home. The pain has not been resolving. He has worried this might be a hernia.  Past Medical History: Patient Active Problem List   Diagnosis Date Noted   Dysfunction of left eustachian tube 07/21/2021   Excessive cerumen in both ear canals 07/21/2021   Acute serous otitis media of left ear 07/21/2021   Strain of great toe, left, initial encounter 04/22/2020   Closed nondisplaced fracture of distal phalanx of left great toe 04/22/2020   Healthcare maintenance 05/25/2019   Family history of colon cancer in mother 05/25/2019   Multiple atypical nevi 05/25/2019   Past Surgical History:  Procedure Laterality Date   TONSILLECTOMY     WISDOM TOOTH EXTRACTION     Family History  Problem Relation Age of Onset   Colon cancer Mother 49   Colon polyps Mother    Esophageal cancer Neg Hx    Rectal cancer Neg Hx    Stomach cancer Neg Hx    Outpatient Medications Prior to Visit  Medication Sig Dispense Refill   Multiple Vitamin (MULTIVITAMIN) LIQD Take 5 mLs by mouth daily.     carbamide peroxide (DEBROX) 6.5 % OTIC solution Place 5 drops into both ears 2 (two) times daily. 15 mL 0   No facility-administered medications prior to visit.   No Known Allergies   Objective:   Today's Vitals   02/03/23 1057  BP: 116/68  Pulse: 60  Temp: (!) 97.1 F (36.2 C)  TempSrc: Temporal  SpO2: 98%  Weight:  188 lb 6.4 oz (85.5 kg)  Height: 5' 9"$  (1.753 m)   Body mass index is 27.82 kg/m.   General: Well developed, well nourished. No acute distress. Abdomen: There is point tenderness over the left lower abdomen. NO bulging noted with Valsalva. Pain   remains the same intensity while palpating with the head raised from the exam table. Psych: Alert and oriented. Normal mood and affect.  Health Maintenance Due  Topic Date Due   Hepatitis C Screening  Never done     Assessment & Plan:   Problem List Items Addressed This Visit       Musculoskeletal and Integument   Sprain of abdominal wall - Primary    I see no evidence of an acute hernia. Exam consistent with an abdominal wall muscle strain. I recommend he use heat and I will prescribe a 1-week course of naproxen 500 mg bid.      Relevant Medications   naproxen (NAPROSYN) 500 MG tablet    Return if symptoms worsen or fail to improve.   Haydee Salter, MD

## 2023-03-21 ENCOUNTER — Encounter: Payer: Self-pay | Admitting: *Deleted

## 2023-04-14 ENCOUNTER — Encounter: Payer: Self-pay | Admitting: Family Medicine

## 2023-04-14 ENCOUNTER — Ambulatory Visit (INDEPENDENT_AMBULATORY_CARE_PROVIDER_SITE_OTHER): Payer: 59 | Admitting: Family Medicine

## 2023-04-14 VITALS — BP 126/88 | HR 60 | Temp 98.0°F | Ht 69.0 in | Wt 184.4 lb

## 2023-04-14 DIAGNOSIS — L719 Rosacea, unspecified: Secondary | ICD-10-CM | POA: Diagnosis not present

## 2023-04-14 MED ORDER — METRONIDAZOLE 0.75 % EX LOTN: TOPICAL_LOTION | CUTANEOUS | 1 refills | Status: DC

## 2023-04-14 NOTE — Progress Notes (Signed)
Established Patient Office Visit   Subjective:  Patient ID: William Rasmussen, male    DOB: 06/04/1976  Age: 47 y.o. MRN: [id number]  Chief Complaint  Patient presents with   Redness on face    For two to three weeks. Comes and goes, feels similar to sunburn somewhat.    HPI Encounter Diagnoses  Name Primary?   Rosacea Yes   2 to 3-week history of facial flushing with a mild burning sensation.  Denies any burning or itching around his eyes.  No joint pains.  No fevers chills.  History of irritable bowel.  History of acne as a kid.  Did not tolerate tetracycline well.  Here today with his 2 young children.   Review of Systems  Constitutional: Negative.   HENT: Negative.    Eyes:  Negative for blurred vision, discharge and redness.  Respiratory: Negative.    Cardiovascular: Negative.   Gastrointestinal:  Negative for abdominal pain.  Genitourinary: Negative.   Musculoskeletal: Negative.  Negative for myalgias.  Skin:  Positive for rash.  Neurological:  Negative for tingling, loss of consciousness and weakness.  Endo/Heme/Allergies:  Negative for polydipsia.     Current Outpatient Medications:    METRONIDAZOLE, TOPICAL, 0.75 % LOTN, Apply a thin coat to rash on face daily, Disp: 59 mL, Rfl: 1   Multiple Vitamin (MULTIVITAMIN) LIQD, Take 5 mLs by mouth daily., Disp: , Rfl:    naproxen (NAPROSYN) 500 MG tablet, Take 1 tablet (500 mg total) by mouth 2 (two) times daily with a meal., Disp: 30 tablet, Rfl: 0   Objective:     BP 126/88 (BP Location: Left Arm, Patient Position: Sitting)   Pulse 60   Temp 98 F (36.7 C) (Temporal)   Ht 5\' 9"  (1.753 m)   Wt 184 lb 6.4 oz (83.6 kg)   SpO2 98%   BMI 27.23 kg/m    Physical Exam Constitutional:      General: He is not in acute distress.    Appearance: Normal appearance. He is not ill-appearing, toxic-appearing or diaphoretic.  HENT:     Head: Normocephalic and atraumatic.     Right Ear: External ear normal.     Left Ear:  External ear normal.  Eyes:     General: No scleral icterus.       Right eye: No discharge.        Left eye: No discharge.     Extraocular Movements: Extraocular movements intact.     Conjunctiva/sclera: Conjunctivae normal.  Pulmonary:     Effort: Pulmonary effort is normal. No respiratory distress.  Skin:    General: Skin is warm and dry.     Comments: There are fine erythematous papules overlying generalized erythema of the cheeks and forehead.  Prominent telangiectasias are present.  Nose and eyelids are spared.  Neurological:     Mental Status: He is alert and oriented to person, place, and time.  Psychiatric:        Mood and Affect: Mood normal.        Behavior: Behavior normal.      No results found for any visits on 04/14/23.    The ASCVD Risk score (Arnett DK, et al., 2019) failed to calculate for the following reasons:   Cannot find a previous HDL lab   Cannot find a previous total cholesterol lab    Assessment & Plan:   Rosacea -     metroNIDAZOLE; Apply a thin coat to rash on face daily  Dispense: 59 mL; Refill: 1 -     Sedimentation rate -     C-reactive protein    Return in about 8 weeks (around 06/09/2023).  Information on rosacea was given.  Advised him to avoid sunlight.  Mliss Sax, MD

## 2023-04-15 ENCOUNTER — Encounter: Payer: Self-pay | Admitting: Family Medicine

## 2023-04-15 LAB — C-REACTIVE PROTEIN: CRP: <1 mg/dL (ref 0.5–20.0)

## 2023-04-15 LAB — SEDIMENTATION RATE: Sed Rate: 6 mm/hr (ref 0–15)

## 2023-06-05 ENCOUNTER — Encounter: Payer: Self-pay | Admitting: Family Medicine

## 2023-06-05 ENCOUNTER — Telehealth: Payer: 59 | Admitting: Physician Assistant

## 2023-06-05 DIAGNOSIS — H109 Unspecified conjunctivitis: Secondary | ICD-10-CM

## 2023-06-06 MED ORDER — POLYMYXIN B-TRIMETHOPRIM 10000-0.1 UNIT/ML-% OP SOLN: 1.0000 [drp] | OPHTHALMIC | 0 refills | Status: DC

## 2023-06-06 NOTE — Progress Notes (Signed)

## 2023-08-11 ENCOUNTER — Encounter: Payer: Self-pay | Admitting: Internal Medicine

## 2023-08-11 ENCOUNTER — Ambulatory Visit: Payer: 59 | Admitting: Internal Medicine

## 2023-08-11 VITALS — BP 120/80 | HR 91 | Temp 98.6°F | Ht 69.0 in | Wt 182.8 lb

## 2023-08-11 DIAGNOSIS — J22 Unspecified acute lower respiratory infection: Secondary | ICD-10-CM

## 2023-08-11 LAB — POCT INFLUENZA A/B
Influenza A, POC: NEGATIVE
Influenza B, POC: NEGATIVE

## 2023-08-11 LAB — POC COVID19 BINAXNOW: SARS Coronavirus 2 Ag: NEGATIVE

## 2023-08-11 MED ORDER — PROMETHAZINE-DM 6.25-15 MG/5ML PO SYRP: 5.0000 mL | ORAL_SOLUTION | Freq: Four times a day (QID) | ORAL | 0 refills | Status: AC | PRN

## 2023-08-11 MED ORDER — ALBUTEROL SULFATE HFA 108 (90 BASE) MCG/ACT IN AERS
2.0000 | INHALATION_SPRAY | RESPIRATORY_TRACT | 1 refills | Status: AC | PRN
Start: 2023-08-11 — End: ?

## 2023-08-11 MED ORDER — AZITHROMYCIN 250 MG PO TABS: ORAL_TABLET | ORAL | 0 refills | Status: AC

## 2023-08-11 NOTE — Progress Notes (Signed)
[facility] [address] [place] [address] Dept: [phone] Dept Fax: [phone]  Acute Care Office Visit  Subjective:   William Rasmussen 1976/07/19 08/11/2023  Chief Complaint  Patient presents with   Cough   Fever    Taking dayquil and nyquil started Monday night     HPI: URI SYMPTOMS: Onset: 4 days ago  Fever: yes 100-101F , has resolved  Body aches: yes Headache: yes - from coughing Runny nose: no Nasal congestion: no  Sinus pressure: no Post nasal drip: no Cough: yes  - thick mucus,  clear - yellow in color ,continually worsening  Wheezing: no , feels tight in chest when laying down  Ear pain: no Sore throat: no N/V: no  Watery stools: no   Treatments tried: Dayquil/Nyquil   Recent sick contacts: yes, daughter has been sick  The following portions of the patient's history were reviewed and updated as appropriate: past medical history, past surgical history, family history, social history, allergies, medications, and problem list.   Patient Active Problem List   Diagnosis Date Noted   Sprain of abdominal wall 02/03/2023   Dysfunction of left eustachian tube 07/21/2021   Excessive cerumen in both ear canals 07/21/2021   Acute serous otitis media of left ear 07/21/2021   Strain of great toe, left, initial encounter 04/22/2020   Closed nondisplaced fracture of distal phalanx of left great toe 04/22/2020   Healthcare maintenance 05/25/2019   Family history of colon cancer in mother 05/25/2019   Multiple atypical nevi 05/25/2019   Past Medical History:  Diagnosis Date   Family history of colon cancer    mother age 66    IBS (irritable bowel syndrome)    Past Surgical History:  Procedure Laterality Date   TONSILLECTOMY     WISDOM TOOTH EXTRACTION     Family History  Problem Relation Age of Onset   Colon cancer Mother 53   Colon polyps Mother    Esophageal cancer Neg Hx    Rectal cancer  Neg Hx    Stomach cancer Neg Hx     Current Outpatient Medications:    albuterol (VENTOLIN HFA) 108 (90 Base) MCG/ACT inhaler, Inhale 2 puffs into the lungs every 4 (four) hours as needed for wheezing or shortness of breath., Disp: 18 g, Rfl: 1   azithromycin (ZITHROMAX) 250 MG tablet, Take 2 tablets on day 1, then 1 tablet daily on days 2 through 5, Disp: 6 tablet, Rfl: 0   Multiple Vitamin (MULTIVITAMIN) LIQD, Take 5 mLs by mouth daily., Disp: , Rfl:    promethazine-dextromethorphan (PROMETHAZINE-DM) 6.25-15 MG/5ML syrup, Take 5 mLs by mouth 4 (four) times daily as needed for cough., Disp: 180 mL, Rfl: 0 No Known Allergies   ROS: A complete ROS was performed with pertinent positives/negatives noted in the HPI. The remainder of the ROS are negative.    Objective:   Today's Vitals   08/11/23 0854  BP: 120/80  Pulse: 91  Temp: 98.6 F (37 C)  TempSrc: Temporal  SpO2: 98%  Weight: 182 lb 12.8 oz (82.9 kg)  Height: 5\' 9"  (1.753 m)    GENERAL: Well-appearing, in NAD. Well nourished.  SKIN: Pink, warm and dry. No rash, lesion, ulceration, or ecchymoses.  HEENT:    HEAD: Normocephalic, non-traumatic.  EYES: Conjunctive pink without exudate.  EARS: External ear w/o redness, swelling, masses, or lesions. EAC clear. TM's intact, translucent w/o bulging, appropriate landmarks visualized.  NOSE: Septum midline w/o deformity.  Nares patent, w/o drainage. (+) slight maxillary sinus pressure with palpation THROAT: Uvula midline. Oropharynx clear. Tonsils absent. Mucus membranes pink and moist.  NECK: Trachea midline. Full ROM. No lymphadenopathy.  RESPIRATORY: Chest wall symmetrical. Respirations even and non-labored. LLL slight crackles. No audible wheezing, rhonchi. (+) cough CARDIAC: S1, S2 present, regular rate and rhythm. Peripheral pulses 2+ bilaterally.  EXTREMITIES: Without clubbing, cyanosis, or edema.  PSYCH/MENTAL STATUS: Alert, oriented x 3. Cooperative, appropriate mood and  affect.    Results for orders placed or performed in visit on 08/11/23  POCT Influenza A/B  Result Value Ref Range   Influenza A, POC Negative Negative   Influenza B, POC Negative Negative  POC COVID-19  Result Value Ref Range   SARS Coronavirus 2 Ag Negative Negative      Assessment & Plan:  1. Acute lower respiratory infection - POCT Influenza A/B - POC COVID-19 - azithromycin (ZITHROMAX) 250 MG tablet; Take 2 tablets on day 1, then 1 tablet daily on days 2 through 5  Dispense: 6 tablet; Refill: 0 - promethazine-dextromethorphan (PROMETHAZINE-DM) 6.25-15 MG/5ML syrup; Take 5 mLs by mouth 4 (four) times daily as needed for cough.  Dispense: 180 mL; Refill: 0 - albuterol (VENTOLIN HFA) 108 (90 Base) MCG/ACT inhaler; Inhale 2 puffs into the lungs every 4 (four) hours as needed for wheezing or shortness of breath.  Dispense: 18 g; Refill: 1 - patient made aware of concerning signs and symptoms and to seek care if such signs/symptoms occur. If no improvement or worsening of condition, will obtain CXR  Meds ordered this encounter  Medications   azithromycin (ZITHROMAX) 250 MG tablet    Sig: Take 2 tablets on day 1, then 1 tablet daily on days 2 through 5    Dispense:  6 tablet    Refill:  0    Order Specific Question:   Supervising Provider    Answer:   Garnette Gunner [[id number]]   promethazine-dextromethorphan (PROMETHAZINE-DM) 6.25-15 MG/5ML syrup    Sig: Take 5 mLs by mouth 4 (four) times daily as needed for cough.    Dispense:  180 mL    Refill:  0    Order Specific Question:   Supervising Provider    Answer:   Garnette Gunner [[id number]]   albuterol (VENTOLIN HFA) 108 (90 Base) MCG/ACT inhaler    Sig: Inhale 2 puffs into the lungs every 4 (four) hours as needed for wheezing or shortness of breath.    Dispense:  18 g    Refill:  1    Order Specific Question:   Supervising Provider    Answer:   Garnette Gunner [[id number]]   Orders Placed This Encounter  Procedures   POCT  Influenza A/B   POC COVID-19    Order Specific Question:   Previously tested for COVID-19    Answer:   Yes    Order Specific Question:   Resident in a congregate (group) care setting    Answer:   No    Order Specific Question:   Employed in healthcare setting    Answer:   Unknown   Lab Orders         POCT Influenza A/B         POC COVID-19     No images are attached to the encounter or orders placed in the encounter.  Return if symptoms worsen or fail to improve.   Salvatore Decent, FNP

## 2023-11-18 ENCOUNTER — Ambulatory Visit: Payer: 59 | Admitting: Medical

## 2023-11-18 VITALS — BP 120/74 | HR 95 | Temp 102.6°F | Resp 18 | Ht 69.0 in | Wt 187.4 lb

## 2023-11-18 DIAGNOSIS — J101 Influenza due to other identified influenza virus with other respiratory manifestations: Secondary | ICD-10-CM | POA: Diagnosis not present

## 2023-11-18 DIAGNOSIS — R509 Fever, unspecified: Secondary | ICD-10-CM | POA: Diagnosis not present

## 2023-11-18 DIAGNOSIS — H6690 Otitis media, unspecified, unspecified ear: Secondary | ICD-10-CM | POA: Diagnosis not present

## 2023-11-18 LAB — POCT INFLUENZA A/B
Influenza A, POC: POSITIVE — AB
Influenza B, POC: NEGATIVE

## 2023-11-18 MED ORDER — FLUTICASONE PROPIONATE 50 MCG/ACT NA SUSP: 2.0000 | Freq: Every day | NASAL | 1 refills | Status: AC

## 2023-11-18 MED ORDER — OSELTAMIVIR PHOSPHATE 75 MG PO CAPS: 75.0000 mg | ORAL_CAPSULE | Freq: Two times a day (BID) | ORAL | 0 refills | Status: AC

## 2023-11-18 MED ORDER — AZITHROMYCIN 250 MG PO TABS: ORAL_TABLET | ORAL | 0 refills | Status: AC

## 2023-11-18 MED ORDER — BENZONATATE 100 MG PO CAPS: 100.0000 mg | ORAL_CAPSULE | Freq: Three times a day (TID) | ORAL | 0 refills | Status: AC | PRN

## 2023-11-18 NOTE — Progress Notes (Signed)
Subjective:    Patient ID: William Rasmussen, male    DOB: 07/24/1976, 47 y.o.   MRN: [id number]  HPI  Discussed the use of AI scribe software for clinical note transcription with the patient, who gave verbal consent to proceed.  History of Present Illness   The patient, with a recent onset of symptoms, presented with a one-day history of illness. He initially experienced fatigue and chest congestion, which progressed to fever, chills, sweats, and body aches by the following night. He also experienced a significant cough, which was more pronounced at night, leading to disturbed sleep. Despite these symptoms, the patient denied any wheezing or shortness of breath.  The patient had taken a COVID-19 test at home earlier in the day, which returned a negative result. He had not received a flu vaccine this year. The patient's daughter had recently been ill and was treated with over-the-counter flu medication. The patient's symptoms worsened over the course of the day, with the onset of nasal congestion. He had previously been prescribed albuterol for a similar illness, but preferred to avoid it due to its side effects. The patient had not taken any fever-reducing medications since the early hours of the morning.       Review of Systems  Constitutional:  Positive for fever. Negative for chills.  HENT:  Positive for congestion. Negative for ear discharge and ear pain.   Respiratory:  Positive for cough. Negative for choking and wheezing.   Cardiovascular:  Negative for chest pain and palpitations.  Gastrointestinal:  Negative for abdominal pain and blood in stool.  Genitourinary:  Negative for dysuria and frequency.  Musculoskeletal:  Positive for myalgias. Negative for back pain and joint swelling.  Skin:  Negative for rash.  Neurological:  Negative for dizziness, seizures and headaches.  Hematological:  Negative for adenopathy. Does not bruise/bleed easily.  Psychiatric/Behavioral:  Negative for  behavioral problems and decreased concentration.    Past Medical History:  Diagnosis Date   Family history of colon cancer    mother age 55    IBS (irritable bowel syndrome)      Social History   Socioeconomic History   Marital status: Married    Spouse name: Not on file   Number of children: Not on file   Years of education: Not on file   Highest education level: Bachelor's degree (e.g., BA, AB, BS)  Occupational History   Not on file  Tobacco Use   Smoking status: Never   Smokeless tobacco: Never  Vaping Use   Vaping status: Never Used  Substance and Sexual Activity   Alcohol use: Yes    Comment: occassionally has a couple of beers.   Drug use: Not Currently   Sexual activity: Yes  Other Topics Concern   Not on file  Social History Narrative   Not on file   Social Drivers of Health   Financial Resource Strain: Low Risk  (11/18/2023)   Overall Financial Resource Strain (CARDIA)    Difficulty of Paying Living Expenses: Not hard at all  Food Insecurity: No Food Insecurity (11/18/2023)   Hunger Vital Sign    Worried About Running Out of Food in the Last Year: Never true    Ran Out of Food in the Last Year: Never true  Transportation Needs: No Transportation Needs (11/18/2023)   PRAPARE - Administrator, Civil Service (Medical): No    Lack of Transportation (Non-Medical): No  Physical Activity: Sufficiently Active (11/18/2023)   Exercise Vital  Sign    Days of Exercise per Week: 4 days    Minutes of Exercise per Session: 60 min  Stress: No Stress Concern Present (11/18/2023)   [employer] of Occupational Health - Occupational Stress Questionnaire    Feeling of Stress : Not at all  Social Connections: Not on file  Intimate Partner Violence: Not on file    Past Surgical History:  Procedure Laterality Date   TONSILLECTOMY     WISDOM TOOTH EXTRACTION      Family History  Problem Relation Age of Onset   Colon cancer Mother 47   Colon polyps  Mother    Esophageal cancer Neg Hx    Rectal cancer Neg Hx    Stomach cancer Neg Hx     No Known Allergies  Current Outpatient Medications on File Prior to Visit  Medication Sig Dispense Refill   albuterol (VENTOLIN HFA) 108 (90 Base) MCG/ACT inhaler Inhale 2 puffs into the lungs every 4 (four) hours as needed for wheezing or shortness of breath. 18 g 1   Multiple Vitamin (MULTIVITAMIN) LIQD Take 5 mLs by mouth daily.     promethazine-dextromethorphan (PROMETHAZINE-DM) 6.25-15 MG/5ML syrup Take 5 mLs by mouth 4 (four) times daily as needed for cough. 180 mL 0   No current facility-administered medications on file prior to visit.    BP 120/74   Pulse 95   Temp (!) 102.6 F (39.2 C) (Oral)   Resp 18   Ht 5\' 9"  (1.753 m)   Wt 187 lb 6.4 oz (85 kg)   SpO2 97%   BMI 27.67 kg/m         Objective:   Physical Exam  General Mental Status- Alert. General Appearance- Not in acute distress.   Skin General: Color- Normal Color. Moisture- Normal Moisture.  Neck Carotid Arteries- Normal color. Moisture- Normal Moisture. No carotid bruits. No JVD.  Chest and Lung Exam Auscultation: Breath Sounds:-Normal.  Cardiovascular Auscultation:Rythm- Regular. Murmurs & Other Heart Sounds:Auscultation of the heart reveals- No Murmurs.  Abdomen Inspection:-Inspeection Normal. Palpation/Percussion:Note:No mass. Palpation and Percussion of the abdomen reveal- Non Tender, Non Distended + BS, no rebound or guarding.   Neurologic Cranial Nerve exam:- CN III-XII intact(No nystagmus), symmetric smile Strength:- 5/5 equal and symmetric strength both upper and lower extremities.       Assessment & Plan:   Assessment and Plan    Patient Instructions  Influenza A Positive rapid flu test. Fever, chills, body aches, and cough started yesterday. No shortness of breath or wheezing. -Start Tamiflu 75mg  BID for 5 days. -Continue benzonatate for cough as needed. -Use Flonase for nasal  congestion. -Advise rest and hydration. -Use Tylenol and ibuprofen for fever and body aches.  Otitis Media Right ear is bright red on exam, suggestive of an ear infection. -Start Azithromycin, two tablets on the first day and one tablet for the next four days.  Follow-up Rest until next Wednesday or Friday. Follow up date to be determined based on how you do clinically.     Esperanza Richters, PA-C

## 2023-11-18 NOTE — Patient Instructions (Signed)
Influenza A Positive rapid flu test. Fever, chills, body aches, and cough started yesterday. No shortness of breath or wheezing. -Start Tamiflu 75mg  BID for 5 days. -Continue benzonatate for cough as needed. -Use Flonase for nasal congestion. -Advise rest and hydration. -Use Tylenol and ibuprofen for fever and body aches.  Otitis Media Right ear is bright red on exam, suggestive of an ear infection. -Start Azithromycin, two tablets on the first day and one tablet for the next four days.  Follow-up Rest until next Wednesday or Friday. Follow up date to be determined based on how you do clinically.

## 2024-03-07 ENCOUNTER — Emergency Department (HOSPITAL_BASED_OUTPATIENT_CLINIC_OR_DEPARTMENT_OTHER)

## 2024-03-07 ENCOUNTER — Encounter (HOSPITAL_BASED_OUTPATIENT_CLINIC_OR_DEPARTMENT_OTHER): Payer: Self-pay

## 2024-03-07 ENCOUNTER — Other Ambulatory Visit: Payer: Self-pay

## 2024-03-07 ENCOUNTER — Emergency Department (HOSPITAL_BASED_OUTPATIENT_CLINIC_OR_DEPARTMENT_OTHER)
Admission: EM | Admit: 2024-03-07 | Discharge: 2024-03-08 | Disposition: A | Discharge status: Home / Self Care | Attending: Emergency Medicine | Admitting: Emergency Medicine

## 2024-03-07 ENCOUNTER — Ambulatory Visit
Admission: RE | Admit: 2024-03-07 | Discharge: 2024-03-07 | Disposition: A | Discharge status: Home / Self Care | Source: Ambulatory Visit | Attending: Internal Medicine | Admitting: Internal Medicine

## 2024-03-07 VITALS — BP 162/85 | Temp 98.9°F | Resp 17

## 2024-03-07 DIAGNOSIS — E876 Hypokalemia: Secondary | ICD-10-CM | POA: Insufficient documentation

## 2024-03-07 DIAGNOSIS — R140 Abdominal distension (gaseous): Secondary | ICD-10-CM | POA: Diagnosis not present

## 2024-03-07 DIAGNOSIS — R112 Nausea with vomiting, unspecified: Secondary | ICD-10-CM | POA: Insufficient documentation

## 2024-03-07 DIAGNOSIS — R110 Nausea: Secondary | ICD-10-CM | POA: Diagnosis not present

## 2024-03-07 LAB — COMPREHENSIVE METABOLIC PANEL WITH GFR
ALT: 10 U/L (ref 0–44)
AST: 14 U/L — ABNORMAL LOW (ref 15–41)
Albumin: 3.4 g/dL — ABNORMAL LOW (ref 3.5–5.0)
Alkaline Phosphatase: 48 U/L (ref 38–126)
Anion gap: 9 (ref 5–15)
BUN: 10 mg/dL (ref 6–20)
CO2: 19 mmol/L — ABNORMAL LOW (ref 22–32)
Calcium: 6.7 mg/dL — ABNORMAL LOW (ref 8.9–10.3)
Chloride: 113 mmol/L — ABNORMAL HIGH (ref 98–111)
Creatinine, Ser: 0.62 mg/dL (ref 0.61–1.24)
GFR, Estimated: >60 mL/min (ref >60)
Glucose, Bld: 92 mg/dL (ref 70–99)
Potassium: 3.1 mmol/L — ABNORMAL LOW (ref 3.5–5.1)
Sodium: 141 mmol/L (ref 135–145)
Total Bilirubin: 1.3 mg/dL — ABNORMAL HIGH (ref 0.0–1.2)
Total Protein: 5.3 g/dL — ABNORMAL LOW (ref 6.5–8.1)

## 2024-03-07 LAB — CBC WITH DIFFERENTIAL/PLATELET
Abs Immature Granulocytes: 0.03 10*3/uL (ref 0.00–0.07)
Basophils Absolute: 0 10*3/uL (ref 0.0–0.1)
Basophils Relative: 0 %
Eosinophils Absolute: 0 10*3/uL (ref 0.0–0.5)
Eosinophils Relative: 0 %
HCT: 38.9 % — ABNORMAL LOW (ref 39.0–52.0)
Hemoglobin: 13.8 g/dL (ref 13.0–17.0)
Immature Granulocytes: 0 %
Lymphocytes Relative: 6 %
Lymphs Abs: 0.4 10*3/uL — ABNORMAL LOW (ref 0.7–4.0)
MCH: 35.4 pg — ABNORMAL HIGH (ref 26.0–34.0)
MCHC: 35.5 g/dL (ref 30.0–36.0)
MCV: 99.7 fL (ref 80.0–100.0)
Monocytes Absolute: 0.5 10*3/uL (ref 0.1–1.0)
Monocytes Relative: 6 %
Neutro Abs: 6 10*3/uL (ref 1.7–7.7)
Neutrophils Relative %: 88 %
Platelets: 158 10*3/uL (ref 150–400)
RBC: 3.9 MIL/uL — ABNORMAL LOW (ref 4.22–5.81)
RDW: 12.5 % (ref 11.5–15.5)
WBC: 7 10*3/uL (ref 4.0–10.5)
nRBC: 0 % (ref 0.0–0.2)

## 2024-03-07 LAB — POC COVID19/FLU A&B COMBO
Covid Antigen, POC: NEGATIVE
Influenza A Antigen, POC: NEGATIVE
Influenza B Antigen, POC: NEGATIVE

## 2024-03-07 LAB — LIPASE, BLOOD: Lipase: 19 U/L (ref 11–51)

## 2024-03-07 MED ORDER — POTASSIUM CHLORIDE 10 MEQ/100ML IV SOLN
10.0000 meq | Freq: Once | INTRAVENOUS | Status: AC
  Administered 2024-03-07: 10 meq via INTRAVENOUS
  Filled 2024-03-07: qty 100

## 2024-03-07 MED ORDER — FAMOTIDINE IN NACL 20-0.9 MG/50ML-% IV SOLN
20.0000 mg | Freq: Once | INTRAVENOUS | Status: AC
  Administered 2024-03-07: 20 mg via INTRAVENOUS
  Filled 2024-03-07: qty 50

## 2024-03-07 MED ORDER — ONDANSETRON 4 MG PO TBDP
4.0000 mg | ORAL_TABLET | Freq: Once | ORAL | Status: AC
  Administered 2024-03-07: 4 mg via ORAL

## 2024-03-07 MED ORDER — ONDANSETRON 4 MG PO TBDP: 4.0000 mg | ORAL_TABLET | Freq: Three times a day (TID) | ORAL | 0 refills | Status: AC | PRN

## 2024-03-07 MED ORDER — CALCIUM CARBONATE ANTACID 500 MG PO CHEW
1.0000 | CHEWABLE_TABLET | Freq: Two times a day (BID) | ORAL | 0 refills | Status: AC
Start: 2024-03-07 — End: 2024-04-06

## 2024-03-07 MED ORDER — IOHEXOL 300 MG/ML  SOLN
100.0000 mL | Freq: Once | INTRAMUSCULAR | Status: AC | PRN
Start: 2024-03-07 — End: 2024-03-07
  Administered 2024-03-07: 100 mL via INTRAVENOUS

## 2024-03-07 MED ORDER — POTASSIUM CHLORIDE CRYS ER 20 MEQ PO TBCR: 20.0000 meq | EXTENDED_RELEASE_TABLET | Freq: Every day | ORAL | 0 refills | Status: AC

## 2024-03-07 MED ORDER — CALCIUM GLUCONATE-NACL 1-0.675 GM/50ML-% IV SOLN
1.0000 g | Freq: Once | INTRAVENOUS | Status: AC
  Administered 2024-03-08: 1000 mg via INTRAVENOUS
  Filled 2024-03-07: qty 50

## 2024-03-07 MED ORDER — DROPERIDOL 2.5 MG/ML IJ SOLN
2.5000 mg | Freq: Once | INTRAMUSCULAR | Status: AC
  Administered 2024-03-07: 2.5 mg via INTRAVENOUS
  Filled 2024-03-07: qty 2

## 2024-03-07 NOTE — ED Provider Notes (Signed)
 Care assumed from Dr. Renaye Rakers, patient with flank pain pending CT of the abdomen.  CT shows pancolitis-either infectious or inflammatory.  I have reevaluated the patient, abdominal exam is benign.  He has normal WBC, I feel he is appropriate for outpatient management.  I have independently viewed the images, and agree with the radiologist's interpretation.  I am discharging him with prescription for ciprofloxacin and metronidazole and recommended follow-up with primary care provider upon completion of course of antibiotics.  Return for worsening symptoms.  Results for orders placed or performed during the hospital encounter of 03/07/24  Urinalysis, Routine w reflex microscopic -Urine, Clean Catch   Collection Time: 03/07/24 12:39 AM  Result Value Ref Range   Color, Urine YELLOW YELLOW   APPearance CLEAR CLEAR   Specific Gravity, Urine >1.046 (H) 1.005 - 1.030   pH 7.0 5.0 - 8.0   Glucose, UA NEGATIVE NEGATIVE mg/dL   Hgb urine dipstick NEGATIVE NEGATIVE   Bilirubin Urine NEGATIVE NEGATIVE   Ketones, ur 40 (A) NEGATIVE mg/dL   Protein, ur 30 (A) NEGATIVE mg/dL   Nitrite NEGATIVE NEGATIVE   Leukocytes,Ua NEGATIVE NEGATIVE   RBC / HPF 0-5 0 - 5 RBC/hpf   WBC, UA 0-5 0 - 5 WBC/hpf   Bacteria, UA NONE SEEN NONE SEEN   Squamous Epithelial / HPF 0-5 0 - 5 /HPF   Mucus PRESENT   Comprehensive metabolic panel   Collection Time: 03/07/24  9:58 PM  Result Value Ref Range   Sodium 141 135 - 145 mmol/L   Potassium 3.1 (L) 3.5 - 5.1 mmol/L   Chloride 113 (H) 98 - 111 mmol/L   CO2 19 (L) 22 - 32 mmol/L   Glucose, Bld 92 70 - 99 mg/dL   BUN 10 6 - 20 mg/dL   Creatinine, Ser 1.61 0.61 - 1.24 mg/dL   Calcium 6.7 (L) 8.9 - 10.3 mg/dL   Total Protein 5.3 (L) 6.5 - 8.1 g/dL   Albumin 3.4 (L) 3.5 - 5.0 g/dL   AST 14 (L) 15 - 41 U/L   ALT 10 0 - 44 U/L   Alkaline Phosphatase 48 38 - 126 U/L   Total Bilirubin 1.3 (H) 0.0 - 1.2 mg/dL   GFR, Estimated >09 >60 mL/min   Anion gap 9 5 - 15  CBC with  Differential   Collection Time: 03/07/24  9:58 PM  Result Value Ref Range   WBC 7.0 4.0 - 10.5 K/uL   RBC 3.90 (L) 4.22 - 5.81 MIL/uL   Hemoglobin 13.8 13.0 - 17.0 g/dL   HCT 45.4 (L) 09.8 - 11.9 %   MCV 99.7 80.0 - 100.0 fL   MCH 35.4 (H) 26.0 - 34.0 pg   MCHC 35.5 30.0 - 36.0 g/dL   RDW 14.7 82.9 - 56.2 %   Platelets 158 150 - 400 K/uL   nRBC 0.0 0.0 - 0.2 %   Neutrophils Relative % 88 %   Neutro Abs 6.0 1.7 - 7.7 K/uL   Lymphocytes Relative 6 %   Lymphs Abs 0.4 (L) 0.7 - 4.0 K/uL   Monocytes Relative 6 %   Monocytes Absolute 0.5 0.1 - 1.0 K/uL   Eosinophils Relative 0 %   Eosinophils Absolute 0.0 0.0 - 0.5 K/uL   Basophils Relative 0 %   Basophils Absolute 0.0 0.0 - 0.1 K/uL   Immature Granulocytes 0 %   Abs Immature Granulocytes 0.03 0.00 - 0.07 K/uL  Lipase, blood   Collection Time: 03/07/24  9:58 PM  Result Value Ref Range   Lipase 19 11 - 51 U/L   CT ABDOMEN PELVIS W CONTRAST Result Date: 03/07/2024 CLINICAL DATA:  Left lower quadrant abdominal pain, vomiting and distention EXAM: CT ABDOMEN AND PELVIS WITH CONTRAST TECHNIQUE: Multidetector CT imaging of the abdomen and pelvis was performed using the standard protocol following bolus administration of intravenous contrast. RADIATION DOSE REDUCTION: This exam was performed according to the departmental dose-optimization program which includes automated exposure control, adjustment of the mA and/or kV according to patient size and/or use of iterative reconstruction technique. CONTRAST:  OMNIPAQUE IOHEXOL 300 MG/ML  SOLN COMPARISON:  None Available. FINDINGS: Lower chest: No acute abnormality. Hepatobiliary: Unremarkable liver. Normal gallbladder. No biliary dilation. Pancreas: Unremarkable. Spleen: Unremarkable. Adrenals/Urinary Tract: Normal adrenal glands. No urinary calculi or hydronephrosis. Bladder is unremarkable. Stomach/Bowel: Normal caliber large and small bowel. Mild diffuse wall thickening and mucosal  hyperenhancement about the colon. The appendix is normal.Stomach is within normal limits. Vascular/Lymphatic: No significant vascular findings are present. No enlarged abdominal or pelvic lymph nodes. Reproductive: Unremarkable. Other: No free intraperitoneal fluid or air. Musculoskeletal: No acute fracture. IMPRESSION: Infectious or inflammatory pancolitis. Electronically Signed   By: Minerva Fester M.D.   On: 03/07/2024 22:57      Dione Booze, MD 03/08/24 [phone]

## 2024-03-07 NOTE — ED Triage Notes (Signed)
 Pt c/o left side rib cage pain that began today. He has also had nausea and chills today. C/O diarrhea for 3 days.

## 2024-03-07 NOTE — ED Notes (Signed)
 Patient is being discharged from the Urgent Care and sent to the Emergency Department via POV . Per Reita May, FNP, patient is in need of higher level of care due to possible SBO. Patient is aware and verbalizes understanding of plan of care.  Vitals:   03/07/24 1820  BP: (!) 162/85  Resp: 17  Temp: 98.9 F (37.2 C)  SpO2: 96%

## 2024-03-07 NOTE — ED Triage Notes (Signed)
 Pt arrives with reports of left sided flank pain with nausea starting today had EKG and nausea medications at [facility]. Pt endorses diarrhea 5 times today. Denies any urinary frequency or blood in the urine.

## 2024-03-07 NOTE — ED Provider Notes (Addendum)
 [name]    CSN: [id number] Arrival date & time: 03/07/24  1759      History   Chief Complaint Chief Complaint  Patient presents with   Chest Injury    Since mid morning my left side of my chest and rib cage has hurt , it hurts to breathe in all the way. Almost feels like a rib injury. I have not done anything to claspe a lung or strain my rib area. - Entered by patient    HPI William Rasmussen is a 48 y.o. male.   William Rasmussen is a 48 y.o. male presenting for chief complaint of abdominal distention, nausea without vomiting, diarrhea, and generalized malaise that started gradually throughout the day today.  He noticed his abdomen his become more distended throughout the day.  He was driving home this afternoon from work when pain suddenly worsened to the left upper quadrant abdomen/left lateral rib cage.  He states pain feels like someone "punched him in the gut".  He attempted to eat dinner with his family but was too nauseous to eat and went to lay down.  He notes his abdomen feels very firm.  He had 3 episodes of loose stools today and attributes this to his IBS.  Denies recent constipation, blood/mucus in stools.  Remains nauseous but has not vomited today.  Denies flank pain, urinary symptoms, heart palpitations, dizziness, near-syncope, shortness of breath, cough, upper respiratory infection symptoms, and recent trauma/injuries to the abdomen/back/rib cage.  No history of abdominal surgeries.  Drinks alcohol socially.  He has not attempted use of any over-the-counter medications to help with symptoms PTA.  Currently with low-grade fever at 99.7.     Past Medical History:  Diagnosis Date   Family history of colon cancer    mother age 64    IBS (irritable bowel syndrome)     Patient Active Problem List   Diagnosis Date Noted   Sprain of abdominal wall 02/03/2023   Dysfunction of left eustachian tube 07/21/2021   Excessive cerumen in both ear canals 07/21/2021    Acute serous otitis media of left ear 07/21/2021   Strain of great toe, left, initial encounter 04/22/2020   Closed nondisplaced fracture of distal phalanx of left great toe 04/22/2020   Healthcare maintenance 05/25/2019   Family history of colon cancer in mother 05/25/2019   Multiple atypical nevi 05/25/2019    Past Surgical History:  Procedure Laterality Date   TONSILLECTOMY     WISDOM TOOTH EXTRACTION         Home Medications    Prior to Admission medications   Medication Sig Start Date End Date Taking? Authorizing Provider  albuterol (VENTOLIN HFA) 108 (90 Base) MCG/ACT inhaler Inhale 2 puffs into the lungs every 4 (four) hours as needed for wheezing or shortness of breath. 08/11/23   Salvatore Decent, FNP  benzonatate (TESSALON) 100 MG capsule Take 1 capsule (100 mg total) by mouth 3 (three) times daily as needed for cough. 11/18/23   Saguier, Ramon Dredge, PA-C  fluticasone (FLONASE) 50 MCG/ACT nasal spray Place 2 sprays into both nostrils daily. 11/18/23   Saguier, Ramon Dredge, PA-C  Multiple Vitamin (MULTIVITAMIN) LIQD Take 5 mLs by mouth daily.    [provider]  oseltamivir (TAMIFLU) 75 MG capsule Take 1 capsule (75 mg total) by mouth 2 (two) times daily. 11/18/23   Saguier, Ramon Dredge, PA-C  promethazine-dextromethorphan (PROMETHAZINE-DM) 6.25-15 MG/5ML syrup Take 5 mLs by mouth 4 (four) times daily as needed for cough.  08/11/23   Salvatore Decent, FNP    Family History Family History  Problem Relation Age of Onset   Colon cancer Mother 60   Colon polyps Mother    Esophageal cancer Neg Hx    Rectal cancer Neg Hx    Stomach cancer Neg Hx     Social History Social History   Tobacco Use   Smoking status: Never   Smokeless tobacco: Never  Vaping Use   Vaping status: Never Used  Substance Use Topics   Alcohol use: Yes    Comment: occassionally has a couple of beers.   Drug use: Not Currently     Allergies   Patient has no known allergies.   Review of  Systems Review of Systems Per HPI  Physical Exam Triage Vital Signs ED Triage Vitals  Encounter Vitals Group     BP 03/07/24 1820 (!) 162/85     Systolic BP Percentile --      Diastolic BP Percentile --      Pulse --      Resp 03/07/24 1820 17     Temp 03/07/24 1820 98.9 F (37.2 C)     Temp Source 03/07/24 1820 Oral     SpO2 03/07/24 1820 96 %     Weight --      Height --      Head Circumference --      Peak Flow --      Pain Score 03/07/24 1824 9     Pain Loc --      Pain Education --      Exclude from Growth Chart --    No data found.  Updated Vital Signs BP (!) 162/85 (BP Location: Right Arm)   Temp 98.9 F (37.2 C) (Oral)   Resp 17   SpO2 96%   Visual Acuity Right Eye Distance:   Left Eye Distance:   Bilateral Distance:    Right Eye Near:   Left Eye Near:    Bilateral Near:     Physical Exam Vitals and nursing note reviewed.  Constitutional:      Appearance: He is ill-appearing. He is not toxic-appearing.  HENT:     Head: Normocephalic and atraumatic.     Right Ear: Hearing and external ear normal.     Left Ear: Hearing and external ear normal.     Nose: Nose normal.     Mouth/Throat:     Lips: Pink.     Mouth: Mucous membranes are moist.     Pharynx: No posterior oropharyngeal erythema.  Eyes:     General: Lids are normal. Vision grossly intact. Gaze aligned appropriately.     Extraocular Movements: Extraocular movements intact.     Conjunctiva/sclera: Conjunctivae normal.  Cardiovascular:     Rate and Rhythm: Normal rate and regular rhythm.     Heart sounds: Normal heart sounds, S1 normal and S2 normal.  Pulmonary:     Effort: Pulmonary effort is normal. No respiratory distress.     Breath sounds: Normal breath sounds and air entry. No wheezing, rhonchi or rales.  Abdominal:     General: Abdomen is protuberant. Bowel sounds are decreased. There is distension.     Palpations: Abdomen is rigid.     Tenderness: There is abdominal tenderness in  the epigastric area, periumbilical area and left upper quadrant. There is guarding. There is no right CVA tenderness or left CVA tenderness.  Musculoskeletal:     Cervical back: Neck supple.  Skin:  General: Skin is warm and dry.     Capillary Refill: Capillary refill takes less than 2 seconds.     Findings: No rash.  Neurological:     General: No focal deficit present.     Mental Status: He is alert and oriented to person, place, and time. Mental status is at baseline.     Cranial Nerves: No dysarthria or facial asymmetry.  Psychiatric:        Mood and Affect: Mood normal.        Speech: Speech normal.        Behavior: Behavior normal.        Thought Content: Thought content normal.        Judgment: Judgment normal.      UC Treatments / Results  Labs (all labs ordered are listed, but only abnormal results are displayed) Labs Reviewed  POC COVID19/FLU A&B COMBO    EKG   Radiology No results found.  Procedures Procedures (including critical care time)  Medications Ordered in UC Medications  ondansetron (ZOFRAN-ODT) disintegrating tablet 4 mg (4 mg Oral Given 03/07/24 1923)    Initial Impression / Assessment and Plan / UC Course  I have reviewed the triage vital signs and the nursing notes.  Pertinent labs & imaging results that were available during my care of the patient were reviewed by me and considered in my medical decision making (see chart for details).   1.  Abdominal distention, nausea without vomiting Clinical concern for acute intra-abdominal abnormality including small bowel obstruction, pancreatitis, etc. Recommend further workup and evaluation in the emergency room to rule out acute abdomen.   Discussed clinical concerns/exam findings leading to recommendation for further workup in the ER setting and risks of deferring ER visit with patient/family. Patient/family express understanding and agreement with plan, discharged to ER via private car.    EKG  performed prior to discharge shows junctional rhythm with diffuse ST depression, no previous EKG on file for comparison. No ST elevation, T wave changes. Discussed case with Dr. Tracie Harrier who agrees with ER disposition and states patient is stable to go by private car.  Zofran 4mg  ODT given for nausea prior to discharge.   Final Clinical Impressions(s) / UC Diagnoses   Final diagnoses:  Abdominal distention  Nausea without vomiting   Discharge Instructions   None    ED Prescriptions   None    PDMP not reviewed this encounter.      Carlisle Beers, [place] 03/07/24 2000

## 2024-03-07 NOTE — ED Provider Notes (Signed)
 [place] [facility] AT [facility] Provider Note   CSN: [id number] Arrival date & time: 03/07/24  2006     History  Chief Complaint  Patient presents with   Flank Pain    William Rasmussen is a 48 y.o. male presented to ED with left flank pain and abdominal distention and nausea.  Patient reports symptoms began today.  He went to urgent care was referred in the ED for further evaluation.  He reports he had severe nausea and significant vomiting and now feels better after the vomiting.  He reports he had a loose bowel movement today but is not constipated.  He does have IBS, no other abdominal surgical history or problems.  Reports he drinks alcohol recreationally, but not in excess, denies history of pancreatitis.  He denies sick contacts in the house.  HPI     Home Medications Prior to Admission medications   Medication Sig Start Date End Date Taking? Authorizing Provider  calcium carbonate (TUMS) 500 MG chewable tablet Chew 1 tablet (200 mg of elemental calcium total) by mouth 2 (two) times daily. 03/07/24 04/06/24 Yes Jahdiel Krol, Kermit Balo, MD  ondansetron (ZOFRAN-ODT) 4 MG disintegrating tablet Take 1 tablet (4 mg total) by mouth every 8 (eight) hours as needed for up to 12 doses for nausea or vomiting. 03/07/24  Yes Caliber Landess, Kermit Balo, MD  potassium chloride SA (KLOR-CON M) 20 MEQ tablet Take 1 tablet (20 mEq total) by mouth daily. 03/07/24  Yes Terald Sleeper, MD  albuterol (VENTOLIN HFA) 108 (90 Base) MCG/ACT inhaler Inhale 2 puffs into the lungs every 4 (four) hours as needed for wheezing or shortness of breath. 08/11/23   Salvatore Decent, FNP  benzonatate (TESSALON) 100 MG capsule Take 1 capsule (100 mg total) by mouth 3 (three) times daily as needed for cough. 11/18/23   Saguier, Ramon Dredge, PA-C  fluticasone (FLONASE) 50 MCG/ACT nasal spray Place 2 sprays into both nostrils daily. 11/18/23   Saguier, Ramon Dredge, PA-C  Multiple Vitamin (MULTIVITAMIN) LIQD Take 5 mLs by mouth  daily.    [provider]  oseltamivir (TAMIFLU) 75 MG capsule Take 1 capsule (75 mg total) by mouth 2 (two) times daily. 11/18/23   Saguier, Ramon Dredge, PA-C  promethazine-dextromethorphan (PROMETHAZINE-DM) 6.25-15 MG/5ML syrup Take 5 mLs by mouth 4 (four) times daily as needed for cough. 08/11/23   Salvatore Decent, FNP      Allergies    Patient has no known allergies.    Review of Systems   Review of Systems  Physical Exam Updated Vital Signs BP 135/84   Pulse 68   Temp 98.5 F (36.9 C) (Oral)   Resp 16   Ht 5\' 9"  (1.753 m)   Wt 83.9 kg   SpO2 97%   BMI 27.32 kg/m  Physical Exam Constitutional:      Comments: Vomiting  HENT:     Head: Normocephalic and atraumatic.  Eyes:     Conjunctiva/sclera: Conjunctivae normal.     Pupils: Pupils are equal, round, and reactive to light.  Cardiovascular:     Rate and Rhythm: Normal rate and regular rhythm.  Pulmonary:     Effort: Pulmonary effort is normal. No respiratory distress.  Abdominal:     General: There is distension.     Tenderness: There is abdominal tenderness.  Skin:    General: Skin is warm and dry.  Neurological:     General: No focal deficit present.     Mental Status: He is alert. Mental status  is at baseline.  Psychiatric:        Mood and Affect: Mood normal.        Behavior: Behavior normal.     ED Results / Procedures / Treatments   Labs (all labs ordered are listed, but only abnormal results are displayed) Labs Reviewed  COMPREHENSIVE METABOLIC PANEL WITH GFR - Abnormal; Notable for the following components:      Result Value   Potassium 3.1 (*)    Chloride 113 (*)    CO2 19 (*)    Calcium 6.7 (*)    Total Protein 5.3 (*)    Albumin 3.4 (*)    AST 14 (*)    Total Bilirubin 1.3 (*)    All other components within normal limits  CBC WITH DIFFERENTIAL/PLATELET - Abnormal; Notable for the following components:   RBC 3.90 (*)    HCT 38.9 (*)    MCH 35.4 (*)    Lymphs Abs 0.4 (*)    All other  components within normal limits  LIPASE, BLOOD    EKG None  Radiology No results found.  Procedures Procedures    Medications Ordered in ED Medications  potassium chloride 10 mEq in 100 mL IVPB (has no administration in time range)  calcium gluconate 1 g/ 50 mL sodium chloride IVPB (has no administration in time range)  droperidol (INAPSINE) 2.5 MG/ML injection 2.5 mg (2.5 mg Intravenous Given 03/07/24 2113)  famotidine (PEPCID) IVPB 20 mg premix (0 mg Intravenous Stopped 03/07/24 2159)  iohexol (OMNIPAQUE) 300 MG/ML solution 100 mL (100 mLs Intravenous Contrast Given 03/07/24 2244)    ED Course/ Medical Decision Making/ A&P                                 Medical Decision Making Amount and/or Complexity of Data Reviewed Labs: ordered. Radiology: ordered.  Risk Prescription drug management.   This patient presents to the ED with concern for nausea, abdominal pain and distention, vomiting. This involves an extensive number of treatment options, and is a complaint that carries with it a high risk of complications and morbidity.  The differential diagnosis includes ureteral stone versus colitis versus diverticulitis versus pancreatitis versus ileus versus other  External records from outside source obtained and reviewed including urgent care evaluation from today  I ordered and personally interpreted labs.  The pertinent results include: White blood cell count within normal limits.  CMP notable for potassium 3.1, calcium 6.7.  I ordered imaging studies including CT abdomen pelvis with contrast, which was pending at the time of signout.  The patient was maintained on a cardiac monitor.  I personally viewed and interpreted the cardiac monitored which showed an underlying rhythm of: NSR  Per my interpretation the patient's ECG shows sinus rhythm with normal QTc  I ordered medication including IV droperidol for nausea, IV Pepcid for gastritis; IV potassium and calcium for  electrolyte repletion  I have reviewed the patients home medicines and have made adjustments as needed  Test Considered: Low suspicion for AAA or mesenteric ischemia or ACS  Disposition:  The patient is signed out to Dr Dione Booze EDP at 11 pm pending follow up on CT imaging and reassessment of patient's symptoms after nausea medications.         Final Clinical Impression(s) / ED Diagnoses Final diagnoses:  Hypokalemia  Hypocalcemia  Nausea and vomiting, unspecified vomiting type  Abdominal distension    Rx /  DC Orders ED Discharge Orders          Ordered    potassium chloride SA (KLOR-CON M) 20 MEQ tablet  Daily        03/07/24 2302    calcium carbonate (TUMS) 500 MG chewable tablet  2 times daily        03/07/24 2302    ondansetron (ZOFRAN-ODT) 4 MG disintegrating tablet  Every 8 hours PRN        03/07/24 2302              Terald Sleeper, MD 03/07/24 2304

## 2024-03-08 LAB — URINALYSIS, ROUTINE W REFLEX MICROSCOPIC
Bacteria, UA: NONE SEEN
Bilirubin Urine: NEGATIVE
Glucose, UA: NEGATIVE mg/dL
Hgb urine dipstick: NEGATIVE
Ketones, ur: 40 mg/dL — AB
Leukocytes,Ua: NEGATIVE
Nitrite: NEGATIVE
Protein, ur: 30 mg/dL — AB
Specific Gravity, Urine: >1.046 — ABNORMAL HIGH (ref 1.005–1.030)
pH: 7 (ref 5.0–8.0)

## 2024-03-08 NOTE — ED Notes (Signed)
 D/c home during downtime.

## 2024-03-09 ENCOUNTER — Telehealth: Payer: Self-pay

## 2024-03-09 NOTE — Transitions of Care (Post Inpatient/ED Visit) (Signed)
 03/09/2024  Name: William Rasmussen MRN: [id number] DOB: [birth year]  Today's TOC FU Call Status: Today's TOC FU Call Status:: Successful TOC FU Call Completed TOC FU Call Complete Date: 03/09/24 Patient's Name and Date of Birth confirmed.  Transition Care Management Follow-up Telephone Call Date of Discharge: 03/08/24 Discharge Facility: [facility] ([facility] Type of Discharge: Emergency Department Reason for ED Visit: Other: (abdominal pain and nausea) How have you been since you were released from the hospital?: Better Any questions or concerns?: No  Items Reviewed: Did you receive and understand the discharge instructions provided?: Yes Medications obtained,verified, and reconciled?: Yes (Medications Reviewed) Any new allergies since your discharge?: No Dietary orders reviewed?: NA Do you have support at home?: No  Medications Reviewed Today: Medications Reviewed Today     Reviewed by Leroy Kennedy, CMA (Certified Medical Assistant) on 03/09/24 at 1603  Med List Status: <None>   Medication Order Taking? Sig Documenting Provider Last Dose Status Informant  albuterol (VENTOLIN HFA) 108 (90 Base) MCG/ACT inhaler [id number] Yes Inhale 2 puffs into the lungs every 4 (four) hours as needed for wheezing or shortness of breath. Salvatore Decent, FNP Taking Active   benzonatate (TESSALON) 100 MG capsule [id number] Yes Take 1 capsule (100 mg total) by mouth 3 (three) times daily as needed for cough. Saguier, Ramon Dredge, PA-C Taking Active   calcium carbonate (TUMS) 500 MG chewable tablet [id number] Yes Chew 1 tablet (200 mg of elemental calcium total) by mouth 2 (two) times daily. Terald Sleeper, MD Taking Active   ciprofloxacin (CIPRO) 500 MG tablet [id number] Yes Take 500 mg by mouth 2 (two) times daily. [provider] Taking Active   fluticasone (FLONASE) 50 MCG/ACT nasal spray [id number] Yes Place 2 sprays into both nostrils daily. Saguier, Ramon Dredge, PA-C Taking Active    metroNIDAZOLE (FLAGYL) 500 MG tablet [id number] Yes Take 500 mg by mouth 3 (three) times daily. [provider] Taking Active   Multiple Vitamin (MULTIVITAMIN) LIQD [id number] Yes Take 5 mLs by mouth daily. [provider] Taking Active   ondansetron (ZOFRAN-ODT) 4 MG disintegrating tablet [id number] Yes Take 1 tablet (4 mg total) by mouth every 8 (eight) hours as needed for up to 12 doses for nausea or vomiting. Terald Sleeper, MD Taking Active   oseltamivir (TAMIFLU) 75 MG capsule [id number] Yes Take 1 capsule (75 mg total) by mouth 2 (two) times daily. Saguier, Ramon Dredge, PA-C Taking Active   potassium chloride SA (KLOR-CON M) 20 MEQ tablet [id number] Yes Take 1 tablet (20 mEq total) by mouth daily. Terald Sleeper, MD Taking Active   promethazine-dextromethorphan (PROMETHAZINE-DM) 6.25-15 MG/5ML syrup [id number] Yes Take 5 mLs by mouth 4 (four) times daily as needed for cough. Salvatore Decent, FNP Taking Active             Home Care and Equipment/Supplies: Were Home Health Services Ordered?: NA Any new equipment or medical supplies ordered?: NA  Functional Questionnaire: Do you need assistance with bathing/showering or dressing?: No Do you need assistance with meal preparation?: No Do you need assistance with eating?: No Do you have difficulty maintaining continence: No Do you need assistance with getting out of bed/getting out of a chair/moving?: No Do you have difficulty managing or taking your medications?: No  Follow up appointments reviewed: PCP Follow-up appointment confirmed?: NA (Pt declined at this time) Specialist Hospital Follow-up appointment confirmed?: NA Do you need transportation to your follow-up appointment?: No Do you understand care options if your condition(s) worsen?: Yes-patient verbalized understanding  SIGNATURE Jodelle Green, RMA

## 2024-06-29 ENCOUNTER — Encounter: Payer: Self-pay | Admitting: Family Medicine

## 2024-06-29 NOTE — Telephone Encounter (Signed)
 Copied from CRM [id number]. Topic: Referral - Question >> Jun 29, 2024  3:30 PM Thersia BROCKS wrote: Reason for CRM: Patient called in stated he is still having some abdominal pain, around ribcage , wanted to know if Dr.Kremer thinks he should be referred to a gastroenterologist, if so could a referral be put in , patient stated he has been seen at   [facility]

## 2024-07-02 ENCOUNTER — Encounter: Payer: Self-pay | Admitting: Family Medicine

## 2024-07-02 ENCOUNTER — Ambulatory Visit: Admitting: Family Medicine

## 2024-07-02 VITALS — BP 124/82 | HR 72 | Temp 97.1°F | Ht 69.0 in | Wt 178.8 lb

## 2024-07-02 DIAGNOSIS — D229 Melanocytic nevi, unspecified: Secondary | ICD-10-CM | POA: Diagnosis not present

## 2024-07-02 DIAGNOSIS — R109 Unspecified abdominal pain: Secondary | ICD-10-CM | POA: Diagnosis not present

## 2024-07-02 LAB — POCT URINALYSIS DIPSTICK
Bilirubin, UA: NEGATIVE
Blood, UA: NEGATIVE
Glucose, UA: NEGATIVE
Ketones, UA: 2
Leukocytes, UA: NEGATIVE
Nitrite, UA: NEGATIVE
Protein, UA: NEGATIVE
Spec Grav, UA: >=1.03 — AB (ref 1.010–1.025)
Urobilinogen, UA: 0.2 U/dL
pH, UA: 5.5 (ref 5.0–8.0)

## 2024-07-02 NOTE — Telephone Encounter (Unsigned)
 Copied from CRM [id number]. Topic: General - Other >> Jul 02, 2024 10:10 AM Deleta RAMAN wrote: Reason for CRM: patient is calling to speak with the nurse of pcp. He has an appointment today at 4pm and she left a voice message  in regards to the notes the patient has left. Please give the patient a calla t [phone]

## 2024-07-02 NOTE — Progress Notes (Signed)
 Established Patient Office Visit   Subjective:  Patient ID: William Rasmussen, male    DOB: Dec 10, 1975  Age: 48 y.o. MRN: [id number]  Chief Complaint  Patient presents with   Abdominal Pain    Abdominal pain located on left side x 1 month. Left side. Hurts when laying down. Feels a knot. [facility] to UC 1 month ago and given antibiotic. No improvement. No constipation, NV, fever or chills.     Abdominal Pain Pertinent negatives include no constipation, diarrhea, melena, myalgias, nausea or vomiting.   Encounter Diagnoses  Name Primary?   Abdominal pain, unspecified abdominal location Yes   Multiple atypical nevi    For ongoing abdominal pain since April of this year.  It has not improved since that time.  He was seen urgently at emergent care and referred for an abdominal pelvic CT scan.  Pancolitis was noted and the results that were otherwise normal.  Lab results were noncontributory.  Status post colonoscopy in 2020 was normal save sessile polyp in the sigmoid colon.  Neither study mention the presence of diverticulum.  Ongoing history of IBS with loose stool.  Weight loss has been intentional.  No night sweats.  No melena or hematochezia.   Review of Systems  Constitutional: Negative.   HENT: Negative.    Eyes:  Negative for blurred vision, discharge and redness.  Respiratory: Negative.    Cardiovascular: Negative.   Gastrointestinal:  Positive for abdominal pain. Negative for blood in stool, constipation, diarrhea, melena, nausea and vomiting.  Genitourinary: Negative.   Musculoskeletal: Negative.  Negative for myalgias.  Skin:  Negative for rash.  Neurological:  Negative for tingling, loss of consciousness and weakness.  Endo/Heme/Allergies:  Negative for polydipsia.     Current Outpatient Medications:    albuterol  (VENTOLIN  HFA) 108 (90 Base) MCG/ACT inhaler, Inhale 2 puffs into the lungs every 4 (four) hours as needed for wheezing or shortness of breath., Disp: 18 g, Rfl: 1    fluticasone  (FLONASE ) 50 MCG/ACT nasal spray, Place 2 sprays into both nostrils daily., Disp: 16 g, Rfl: 1   Multiple Vitamin (MULTIVITAMIN) LIQD, Take 5 mLs by mouth daily., Disp: , Rfl:    potassium chloride  SA (KLOR-CON  M) 20 MEQ tablet, Take 1 tablet (20 mEq total) by mouth daily., Disp: 10 tablet, Rfl: 0   benzonatate  (TESSALON ) 100 MG capsule, Take 1 capsule (100 mg total) by mouth 3 (three) times daily as needed for cough. (Patient not taking: Reported on 07/02/2024), Disp: 30 capsule, Rfl: 0   ciprofloxacin (CIPRO) 500 MG tablet, Take 500 mg by mouth 2 (two) times daily. (Patient not taking: Reported on 07/02/2024), Disp: , Rfl:    metroNIDAZOLE  (FLAGYL ) 500 MG tablet, Take 500 mg by mouth 3 (three) times daily. (Patient not taking: Reported on 07/02/2024), Disp: , Rfl:    ondansetron  (ZOFRAN -ODT) 4 MG disintegrating tablet, Take 1 tablet (4 mg total) by mouth every 8 (eight) hours as needed for up to 12 doses for nausea or vomiting. (Patient not taking: Reported on 07/02/2024), Disp: 12 tablet, Rfl: 0   oseltamivir  (TAMIFLU ) 75 MG capsule, Take 1 capsule (75 mg total) by mouth 2 (two) times daily. (Patient not taking: Reported on 07/02/2024), Disp: 10 capsule, Rfl: 0   promethazine -dextromethorphan (PROMETHAZINE -DM) 6.25-15 MG/5ML syrup, Take 5 mLs by mouth 4 (four) times daily as needed for cough. (Patient not taking: Reported on 07/02/2024), Disp: 180 mL, Rfl: 0   Objective:     BP 124/82 (BP Location: Right Arm, Patient  Position: Sitting, Cuff Size: Normal)   Pulse 72   Temp (!) 97.1 F (36.2 C) (Temporal)   Ht 5' 9 (1.753 m)   Wt 178 lb 12.8 oz (81.1 kg)   SpO2 99%   BMI 26.40 kg/m  BP Readings from Last 3 Encounters:  07/02/24 124/82  03/08/24 127/83  03/07/24 (!) 162/85   Wt Readings from Last 3 Encounters:  07/02/24 178 lb 12.8 oz (81.1 kg)  03/07/24 185 lb (83.9 kg)  11/18/23 187 lb 6.4 oz (85 kg)      Physical Exam Constitutional:      General: He is not in acute  distress.    Appearance: Normal appearance. He is not ill-appearing, toxic-appearing or diaphoretic.  HENT:     Head: Normocephalic and atraumatic.     Right Ear: External ear normal.     Left Ear: External ear normal.     Mouth/Throat:     Mouth: Mucous membranes are moist.     Pharynx: Oropharynx is clear. No oropharyngeal exudate or posterior oropharyngeal erythema.  Eyes:     General: No scleral icterus.       Right eye: No discharge.        Left eye: No discharge.     Extraocular Movements: Extraocular movements intact.     Conjunctiva/sclera: Conjunctivae normal.     Pupils: Pupils are equal, round, and reactive to light.  Cardiovascular:     Rate and Rhythm: Normal rate and regular rhythm.  Pulmonary:     Effort: Pulmonary effort is normal. No respiratory distress.     Breath sounds: Normal breath sounds.  Abdominal:     General: Bowel sounds are normal.     Tenderness: There is no abdominal tenderness. There is no guarding.     Hernia: There is no hernia in the umbilical area or ventral area.  Musculoskeletal:     Cervical back: No rigidity or tenderness.  Skin:    General: Skin is warm and dry.  Neurological:     Mental Status: He is alert and oriented to person, place, and time.  Psychiatric:        Mood and Affect: Mood normal.        Behavior: Behavior normal.      No results found for any visits on 07/02/24.    The ASCVD Risk score (Arnett DK, et al., 2019) failed to calculate for the following reasons:   Cannot find a previous HDL lab   Cannot find a previous total cholesterol lab    Assessment & Plan:   Abdominal pain, unspecified abdominal location -     Ambulatory referral to Gastroenterology -     CBC with Differential/Platelet; Future -     Comprehensive metabolic panel with GFR; Future -     Amylase; Future -     Lipase; Future -     C-reactive protein; Future -     Sedimentation rate; Future  Multiple atypical nevi -     Ambulatory  referral to Dermatology    Return if symptoms worsen or fail to improve.    Elsie Sim Lent, MD

## 2024-07-03 ENCOUNTER — Ambulatory Visit: Payer: Self-pay | Admitting: Family Medicine

## 2024-07-03 ENCOUNTER — Other Ambulatory Visit (INDEPENDENT_AMBULATORY_CARE_PROVIDER_SITE_OTHER)

## 2024-07-03 DIAGNOSIS — R109 Unspecified abdominal pain: Secondary | ICD-10-CM

## 2024-07-03 LAB — C-REACTIVE PROTEIN: CRP: <1 mg/dL (ref 0.5–20.0)

## 2024-07-03 LAB — COMPREHENSIVE METABOLIC PANEL WITH GFR
ALT: 16 U/L (ref 0–53)
AST: 17 U/L (ref 0–37)
Albumin: 4.7 g/dL (ref 3.5–5.2)
Alkaline Phosphatase: 66 U/L (ref 39–117)
BUN: 10 mg/dL (ref 6–23)
CO2: 27 meq/L (ref 19–32)
Calcium: 9.6 mg/dL (ref 8.4–10.5)
Chloride: 102 meq/L (ref 96–112)
Creatinine, Ser: 0.9 mg/dL (ref 0.40–1.50)
GFR: 102.78 mL/min (ref >60.00)
Glucose, Bld: 102 mg/dL — ABNORMAL HIGH (ref 70–99)
Potassium: 4 meq/L (ref 3.5–5.1)
Sodium: 141 meq/L (ref 135–145)
Total Bilirubin: 2 mg/dL — ABNORMAL HIGH (ref 0.2–1.2)
Total Protein: 7.1 g/dL (ref 6.0–8.3)

## 2024-07-03 LAB — CBC WITH DIFFERENTIAL/PLATELET
Basophils Absolute: 0 K/uL (ref 0.0–0.1)
Basophils Relative: 0.7 % (ref 0.0–3.0)
Eosinophils Absolute: 0 K/uL (ref 0.0–0.7)
Eosinophils Relative: 0.8 % (ref 0.0–5.0)
HCT: 44.7 % (ref 39.0–52.0)
Hemoglobin: 15.5 g/dL (ref 13.0–17.0)
Lymphocytes Relative: 47.3 % — ABNORMAL HIGH (ref 12.0–46.0)
Lymphs Abs: 2.4 K/uL (ref 0.7–4.0)
MCHC: 34.7 g/dL (ref 30.0–36.0)
MCV: 99 fl (ref 78.0–100.0)
Monocytes Absolute: 0.4 K/uL (ref 0.1–1.0)
Monocytes Relative: 8 % (ref 3.0–12.0)
Neutro Abs: 2.2 K/uL (ref 1.4–7.7)
Neutrophils Relative %: 43.2 % (ref 43.0–77.0)
Platelets: 215 K/uL (ref 150.0–400.0)
RBC: 4.51 Mil/uL (ref 4.22–5.81)
RDW: 13.2 % (ref 11.5–15.5)
WBC: 5.1 K/uL (ref 4.0–10.5)

## 2024-07-03 LAB — AMYLASE: Amylase: 31 U/L (ref 27–131)

## 2024-07-03 LAB — SEDIMENTATION RATE: Sed Rate: 7 mm/h (ref 0–15)

## 2024-07-03 LAB — LIPASE: Lipase: 32 U/L (ref 11.0–59.0)

## 2024-10-01 ENCOUNTER — Encounter: Payer: Self-pay | Admitting: Family Medicine

## 2025-01-10 ENCOUNTER — Encounter: Payer: Self-pay | Admitting: Gastroenterology

## 2025-02-18 ENCOUNTER — Ambulatory Visit: Payer: Self-pay | Admitting: Dermatology
